# Patient Record
Sex: Male | Born: 1990 | Race: Black or African American | Hispanic: No | Marital: Single | Smoking: Never smoker
Health system: Southern US, Community
[De-identification: ages and names within clinical notes are randomized; demographics above are authoritative.]

---

## 2017-03-03 ENCOUNTER — Emergency Department (HOSPITAL_COMMUNITY)
Admission: EM | Admit: 2017-03-03 | Discharge: 2017-03-03 | Disposition: A | Discharge status: Home / Self Care | Payer: Self-pay | Attending: Emergency Medicine | Admitting: Emergency Medicine

## 2017-03-03 ENCOUNTER — Emergency Department (HOSPITAL_COMMUNITY): Payer: Self-pay

## 2017-03-03 ENCOUNTER — Encounter (HOSPITAL_COMMUNITY): Payer: Self-pay

## 2017-03-03 ENCOUNTER — Other Ambulatory Visit: Payer: Self-pay

## 2017-03-03 DIAGNOSIS — R05 Cough: Secondary | ICD-10-CM | POA: Insufficient documentation

## 2017-03-03 DIAGNOSIS — R059 Cough, unspecified: Secondary | ICD-10-CM

## 2017-03-03 DIAGNOSIS — R0789 Other chest pain: Secondary | ICD-10-CM | POA: Insufficient documentation

## 2017-03-03 MED ORDER — IPRATROPIUM-ALBUTEROL 0.5-2.5 (3) MG/3ML IN SOLN
3.0000 mL | Freq: Once | RESPIRATORY_TRACT | Status: AC
  Administered 2017-03-03: 3 mL via RESPIRATORY_TRACT
  Filled 2017-03-03: qty 3

## 2017-03-03 MED ORDER — ACETAMINOPHEN 325 MG PO TABS
650.0000 mg | ORAL_TABLET | Freq: Once | ORAL | Status: AC
  Administered 2017-03-03: 650 mg via ORAL
  Filled 2017-03-03: qty 2

## 2017-03-03 MED ORDER — AEROCHAMBER PLUS FLO-VU LARGE MISC
1.0000 | Freq: Once | Status: AC
  Administered 2017-03-03: 1

## 2017-03-03 MED ORDER — ACETAMINOPHEN 500 MG PO TABS: 500.0000 mg | ORAL_TABLET | Freq: Four times a day (QID) | ORAL | 0 refills | Status: DC | PRN

## 2017-03-03 MED ORDER — CETIRIZINE HCL 10 MG PO TABS: 10.0000 mg | ORAL_TABLET | Freq: Every day | ORAL | 1 refills | Status: DC

## 2017-03-03 MED ORDER — ALBUTEROL SULFATE HFA 108 (90 BASE) MCG/ACT IN AERS
2.0000 | INHALATION_SPRAY | Freq: Once | RESPIRATORY_TRACT | Status: AC
  Administered 2017-03-03: 2 via RESPIRATORY_TRACT
  Filled 2017-03-03: qty 6.7

## 2017-03-03 NOTE — ED Provider Notes (Signed)
[facility]   CSN: [id number] Arrival date & time: 03/03/17  1323     History   Chief Complaint Chief Complaint  Patient presents with  . Chest Pain  . Headache  . Cough    HPI Eugene Middleton is a 26 y.o. male.  Eugene Middleton is a 26 y.o. who presents to the emergency department complaining of cough ongoing for the past 2 weeks with associated substernal nonradiating chest pain.  He reports cough starting about 2 weeks ago and later developing chest pain. He reports his chest pain is constant and is worse with palpation of the area.  He denies any current shortness of breath.  He reports dry cough that is worse when he lies down to sleep at night.  No treatments attempted prior to arrival.  He denies wheezing or fevers.  He denies any allergy symptoms currently.  He does report a history of allergies, but has not been taking his medications for them. He is not a smoker.   He denies fevers, wheezing, sneezing, nasal congestion, postnasal drip, ear pain, shortness of breath, abdominal pain, nausea, hemoptysis, vomiting or rashes.   The history is provided by the patient and medical records. No language interpreter was used.  Chest Pain   Associated symptoms include cough. Pertinent negatives include no abdominal pain, no back pain, no fever, no nausea, no palpitations, no shortness of breath and no vomiting.  Headache   Pertinent negatives include no fever, no palpitations, no shortness of breath, no nausea and no vomiting.  Cough  Associated symptoms include chest pain. Pertinent negatives include no chills, no rhinorrhea, no sore throat, no shortness of breath and no wheezing.    History reviewed. No pertinent past medical history.  There are no active problems to display for this patient.   History reviewed. No pertinent surgical history.     Home Medications    Prior to Admission medications     Medication Sig Start Date End Date Taking? Authorizing Provider  acetaminophen (TYLENOL) 500 MG tablet Take 1 tablet (500 mg total) by mouth every 6 (six) hours as needed. 03/03/17   Everlene Farrieransie, Ahren Pettinger, PA-C  cetirizine (ZYRTEC ALLERGY) 10 MG tablet Take 1 tablet (10 mg total) by mouth daily. 03/03/17   Everlene Farrieransie, Marijo Quizon, PA-C    Family History History reviewed. No pertinent family history.  Social History Social History   Tobacco Use  . Smoking status: Never Smoker  Substance Use Topics  . Alcohol use: No    Frequency: Never  . Drug use: No     Allergies   Patient has no known allergies.   Review of Systems Review of Systems  Constitutional: Negative for chills and fever.  HENT: Negative for congestion, nosebleeds, postnasal drip, rhinorrhea and sore throat.   Eyes: Negative for visual disturbance.  Respiratory: Positive for cough. Negative for shortness of breath and wheezing.   Cardiovascular: Positive for chest pain. Negative for palpitations and leg swelling.  Gastrointestinal: Negative for abdominal pain, diarrhea, nausea and vomiting.  Genitourinary: Negative for dysuria.  Musculoskeletal: Negative for back pain and neck pain.  Skin: Negative for rash.  Neurological: Negative for light-headedness.     Physical Exam Updated Vital Signs BP 127/78 (BP Location: Right Arm)   Pulse 67   Temp 98.5 F (36.9 C) (Oral)   Resp 16   Ht 5\' 9"  (1.753 m)   Wt 79.4 kg (175 lb)   SpO2 100%  BMI 25.84 kg/m   Physical Exam  Constitutional: He appears well-developed and well-nourished.  Non-toxic appearance. He does not appear ill. No distress.  HENT:  Head: Normocephalic and atraumatic.  Boggy nasal turbinates and rhinorrhea present.  Eyes: Conjunctivae are normal. Pupils are equal, round, and reactive to light. Right eye exhibits no discharge. Left eye exhibits no discharge.  Neck: Neck supple.  Cardiovascular: Normal rate, regular rhythm, normal heart sounds and intact  distal pulses.  Pulses:      Radial pulses are 2+ on the right side, and 2+ on the left side.  Pulmonary/Chest: Effort normal and breath sounds normal. No accessory muscle usage. No tachypnea. No respiratory distress. He has no decreased breath sounds.  Lungs are clear to ascultation bilaterally. Symmetric chest expansion bilaterally. No increased work of breathing. No rales or rhonchi.   Anterior chest wall is tender palpation and reproduces his chest pain.  Abdominal: Soft. There is no tenderness.  Lymphadenopathy:    He has no cervical adenopathy.  Neurological: He is alert. Coordination normal.  Skin: Skin is warm and dry. Capillary refill takes less than 2 seconds. No rash noted. He is not diaphoretic.  Psychiatric: He has a normal mood and affect. His behavior is normal.  Nursing note and vitals reviewed.    ED Treatments / Results  Labs (all labs ordered are listed, but only abnormal results are displayed) Labs Reviewed - No data to display  EKG  EKG Interpretation  Date/Time:  Thursday March 03 2017 13:33:20 EST Ventricular Rate:  75 PR Interval:  130 QRS Duration: 84 QT Interval:  356 QTC Calculation: 397 R Axis:   91 Text Interpretation:  Sinus rhythm with occasional Premature ventricular complexes Rightward axis Borderline ECG No old tracing to compare Confirmed by Linwood Dibbles [id number]) on 03/03/2017 7:18:14 PM       Radiology Dg Chest 2 View  Result Date: 03/03/2017 CLINICAL DATA:  Chest pain and shortness of breath for 2 days EXAM: CHEST  2 VIEW COMPARISON:  None. FINDINGS: The heart size and mediastinal contours are within normal limits. Both lungs are clear. The visualized skeletal structures are unremarkable. IMPRESSION: No active cardiopulmonary disease. Electronically Signed   By: Alcide Clever M.D.   On: 03/03/2017 18:49    Procedures Procedures (including critical care time)  Medications Ordered in ED Medications  albuterol (PROVENTIL HFA;VENTOLIN HFA)  108 (90 Base) MCG/ACT inhaler 2 puff (2 puffs Inhalation Not Given 03/03/17 1925)  AEROCHAMBER PLUS FLO-VU LARGE MISC 1 each (not administered)  ipratropium-albuterol (DUONEB) 0.5-2.5 (3) MG/3ML nebulizer solution 3 mL (3 mLs Nebulization Given 03/03/17 1754)  acetaminophen (TYLENOL) tablet 650 mg (650 mg Oral Given 03/03/17 1754)     Initial Impression / Assessment and Plan / ED Course  I have reviewed the triage vital signs and the nursing notes.  Pertinent labs & imaging results that were available during my care of the patient were reviewed by me and considered in my medical decision making (see chart for details).     This  is a 26 y.o. who presents to the emergency department complaining of cough ongoing for the past 2 weeks with associated substernal nonradiating chest pain.  He reports cough starting about 2 weeks ago and later developing chest pain. He reports his chest pain is constant and is worse with palpation of the area.  He denies any current shortness of breath.  He reports dry cough that is worse when he lies down to sleep at night.  No treatments attempted prior to arrival.  He denies wheezing or fevers.  He denies any allergy symptoms currently.  He does report a history of allergies, but has not been taking his medications for them. He is not a smoker. On exam the patient is afebrile nontoxic-appearing.  His lungs are clear to auscultation bilaterally.  No increased work of breathing.  No wheezes, rales or rhonchi noted on my exam.  He does have some rhinorrhea present on my exam.  He also has reproducible chest wall tenderness to palpation against his anterior chest wall.  No overlying skin changes. EKG is without evidence of STEMI. Patient was provided with breathing treatment, Tylenol and a chest x-ray was obtained.  Chest x-ray is unremarkable. At reevaluation following the breathing treatment and Tylenol patient tells me he is feeling much better.  He believes the breathing  treatment helped.  Repeat lung exam is unchanged.  No wheezing.  No increased work of breathing.  No rales or rhonchi.  No tachypnea or tachycardia.  Low suspicion for ACS in this patient.  I am more suspicious patient is having upper respiratory infection or has allergic rhinitis which is causing his cough.  We will restart him on Zyrtec as well as provide him with an inhaler to use at home.  I discussed instructions on when and how to use the inhaler.  I encouraged him to follow-up with primary care.  Return precautions discussed. I advised the patient to follow-up with their primary care provider this week. I advised the patient to return to the emergency department with new or worsening symptoms or new concerns. The patient verbalized understanding and agreement with plan.     Final Clinical Impressions(s) / ED Diagnoses   Final diagnoses:  Cough  Chest wall pain    ED Discharge Orders        Ordered    cetirizine (ZYRTEC ALLERGY) 10 MG tablet  Daily     03/03/17 1927    acetaminophen (TYLENOL) 500 MG tablet  Every 6 hours PRN     03/03/17 1927       Everlene FarrierDansie, Kwane Rohl, PA-C 03/03/17 1932    Linwood DibblesKnapp, Jon, MD 03/03/17 2352

## 2017-03-03 NOTE — ED Triage Notes (Addendum)
Pt endorses having dry cough for 2 weeks then began having chest pain due to the cough. Chest pain is non radiating, no shob. VSS. Pt has chronic headaches since high school.

## 2017-03-03 NOTE — ED Notes (Signed)
Patient transported to X-ray 

## 2017-10-11 ENCOUNTER — Emergency Department (HOSPITAL_COMMUNITY)
Admission: EM | Admit: 2017-10-11 | Discharge: 2017-10-12 | Disposition: A | Discharge status: Home / Self Care | Payer: Self-pay | Attending: Emergency Medicine | Admitting: Emergency Medicine

## 2017-10-11 ENCOUNTER — Other Ambulatory Visit: Payer: Self-pay

## 2017-10-11 DIAGNOSIS — R569 Unspecified convulsions: Secondary | ICD-10-CM | POA: Insufficient documentation

## 2017-10-12 ENCOUNTER — Emergency Department (HOSPITAL_COMMUNITY): Payer: Self-pay

## 2017-10-12 ENCOUNTER — Encounter (HOSPITAL_COMMUNITY): Payer: Self-pay | Admitting: Emergency Medicine

## 2017-10-12 LAB — CBC
HCT: 43.3 % (ref 39.0–52.0)
HEMOGLOBIN: 13.7 g/dL (ref 13.0–17.0)
MCH: 28.4 pg (ref 26.0–34.0)
MCHC: 31.6 g/dL (ref 30.0–36.0)
MCV: 89.8 fL (ref 78.0–100.0)
PLATELETS: 213 10*3/uL (ref 150–400)
RBC: 4.82 MIL/uL (ref 4.22–5.81)
RDW: 12.5 % (ref 11.5–15.5)
WBC: 4.5 10*3/uL (ref 4.0–10.5)

## 2017-10-12 LAB — I-STAT TROPONIN, ED: Troponin i, poc: 0 ng/mL (ref 0.00–0.08)

## 2017-10-12 LAB — BASIC METABOLIC PANEL
ANION GAP: 11 (ref 5–15)
BUN: 9 mg/dL (ref 6–20)
CO2: 26 mmol/L (ref 22–32)
Calcium: 8.8 mg/dL — ABNORMAL LOW (ref 8.9–10.3)
Chloride: 103 mmol/L (ref 98–111)
Creatinine, Ser: 1.04 mg/dL (ref 0.61–1.24)
GFR calc non Af Amer: >60 mL/min (ref >60)
Glucose, Bld: 103 mg/dL — ABNORMAL HIGH (ref 70–99)
Potassium: 3.7 mmol/L (ref 3.5–5.1)
SODIUM: 140 mmol/L (ref 135–145)

## 2017-10-12 LAB — RAPID URINE DRUG SCREEN, HOSP PERFORMED
AMPHETAMINES: NOT DETECTED
BENZODIAZEPINES: NOT DETECTED
COCAINE: NOT DETECTED
OPIATES: NOT DETECTED
TETRAHYDROCANNABINOL: NOT DETECTED

## 2017-10-12 LAB — HEPATIC FUNCTION PANEL
ALBUMIN: 3.9 g/dL (ref 3.5–5.0)
ALT: 20 U/L (ref 0–44)
AST: 26 U/L (ref 15–41)
Alkaline Phosphatase: 69 U/L (ref 38–126)
BILIRUBIN INDIRECT: 0.8 mg/dL (ref 0.3–0.9)
Bilirubin, Direct: 0.1 mg/dL (ref 0.0–0.2)
TOTAL PROTEIN: 7 g/dL (ref 6.5–8.1)
Total Bilirubin: 0.9 mg/dL (ref 0.3–1.2)

## 2017-10-12 LAB — CK: CK TOTAL: 319 U/L (ref 49–397)

## 2017-10-12 MED ORDER — LEVETIRACETAM 500 MG PO TABS: 500.0000 mg | ORAL_TABLET | Freq: Two times a day (BID) | ORAL | 0 refills | Status: DC

## 2017-10-12 MED ORDER — LEVETIRACETAM 500 MG PO TABS
1000.0000 mg | ORAL_TABLET | Freq: Once | ORAL | Status: AC
  Administered 2017-10-12: 1000 mg via ORAL
  Filled 2017-10-12: qty 2

## 2017-10-12 NOTE — ED Notes (Signed)
Patient transported to X-ray 

## 2017-10-12 NOTE — ED Notes (Signed)
Pt discharged from ED; instructions provided and scripts given; Pt encouraged to return to ED if symptoms worsen and to f/u with PCP; Pt verbalized understanding of all instructions 

## 2017-10-12 NOTE — ED Triage Notes (Signed)
Pt reports he had an episode of "shaking" and then his eyes rolled back in his head. Pt also reports CP and wrist pain. Denies hx of seizures.

## 2017-10-12 NOTE — ED Provider Notes (Signed)
[facility]   CSN: [id number] Arrival date & time: 10/11/17  2347     History   Chief Complaint Chief Complaint  Patient presents with  . Seizure Like Activity    HPI Eugene Middleton is a 27 y.o. male.  The history is provided by the patient.  He was told that he fell down and then had some generalized shaking.  He has no memory of this incident.  He did bite his lip.  Denies bowel or bladder incontinence.  He is not complaining of anything currently.  There is no known history of seizures.  He does not take any medications other than vitamins.  He denies ethanol and drug use.  History reviewed. No pertinent past medical history.  There are no active problems to display for this patient.   History reviewed. No pertinent surgical history.      Home Medications    Prior to Admission medications   Medication Sig Start Date End Date Taking? Authorizing Provider  acetaminophen (TYLENOL) 500 MG tablet Take 1 tablet (500 mg total) by mouth every 6 (six) hours as needed. 03/03/17   Everlene Farrieransie, William, PA-C  cetirizine (ZYRTEC ALLERGY) 10 MG tablet Take 1 tablet (10 mg total) by mouth daily. 03/03/17   Everlene Farrieransie, William, PA-C    Family History No family history on file.  Social History Social History   Tobacco Use  . Smoking status: Never Smoker  . Smokeless tobacco: Never Used  Substance Use Topics  . Alcohol use: No    Frequency: Never  . Drug use: No     Allergies   Patient has no known allergies.   Review of Systems Review of Systems  All other systems reviewed and are negative.    Physical Exam Updated Vital Signs BP 135/76   Pulse (!) 56   Temp 98 F (36.7 C) (Oral)   Resp 14   Ht 5\' 8"  (1.727 m)   Wt 97.5 kg (215 lb)   SpO2 100%   BMI 32.69 kg/m   Physical Exam  Nursing note and vitals reviewed.  27 year old male, resting comfortably and in no acute distress. Vital signs are normal.  Oxygen saturation is 100%, which is normal. Head is normocephalic and atraumatic. PERRLA, EOMI. Oropharynx is clear. Neck is nontender and supple without adenopathy or JVD. Back is nontender and there is no CVA tenderness. Lungs are clear without rales, wheezes, or rhonchi. Chest is nontender. Heart has regular rate and rhythm without murmur. Abdomen is soft, flat, nontender without masses or hepatosplenomegaly and peristalsis is normoactive. Extremities have no cyanosis or edema, full range of motion is present. Skin is warm and dry without rash. Neurologic: Mental status is normal, cranial nerves are intact, there are no motor or sensory deficits.  ED Treatments / Results  Labs (all labs ordered are listed, but only abnormal results are displayed) Labs Reviewed  BASIC METABOLIC PANEL - Abnormal; Notable for the following components:      Result Value   Glucose, Bld 103 (*)    Calcium 8.8 (*)    All other components within normal limits  RAPID URINE DRUG SCREEN, HOSP PERFORMED - Abnormal; Notable for the following components:   Barbiturates   (*)    Value: Result not available. Reagent lot number recalled by manufacturer.   All other components within normal limits  CBC  CK  HEPATIC FUNCTION PANEL  I-STAT TROPONIN, ED    EKG EKG  Interpretation  Date/Time:  Wednesday October 12 2017 00:09:36 EDT Ventricular Rate:  68 PR Interval:  164 QRS Duration: 104 QT Interval:  374 QTC Calculation: 397 R Axis:   33 Text Interpretation:  Normal sinus rhythm Normal ECG When compared with ECG of 03/03/2017, Right axis deviation is no longer present Confirmed by Dione Booze ([id number]) on 10/12/2017 4:04:10 AM   Radiology Dg Chest 2 View  Result Date: 10/12/2017 CLINICAL DATA:  Episode of shaking. EXAM: CHEST - 2 VIEW COMPARISON:  None. FINDINGS: Mild cardiomegaly. Mediastinal contours are within normal limits. Both lungs are clear. The visualized skeletal structures are unremarkable.  IMPRESSION: No active cardiopulmonary disease.  Mild cardiac enlargement. Electronically Signed   By: Tollie Eth M.D.   On: 10/12/2017 00:51   Ct Head Wo Contrast  Result Date: 10/12/2017 CLINICAL DATA:  27 y/o M; shaking episode. Possible seizure, new, nontraumatic, 18-40 yrs. EXAM: CT HEAD WITHOUT CONTRAST TECHNIQUE: Contiguous axial images were obtained from the base of the skull through the vertex without intravenous contrast. COMPARISON:  None. 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 No finding of stroke, hemorrhage, or focal mass effect of the brain parenchyma. No extra-axial collection, hydrocephalus, or herniation. Vascular: No hyperdense vessel or unexpected calcification. Skull: Normal. Negative for fracture or focal lesion. Sinuses/Orbits: No acute finding. Other: None. IMPRESSION: 1. 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 2. No acute intracranial abnormality. Electronically Signed   By: Mitzi Hansen M.D.   On: 10/12/2017 06:28    Procedures Procedures  Medications Ordered in ED Medications  levETIRAcetam (KEPPRA) tablet 1,000 mg (has no administration in time range)     Initial Impression / Assessment and Plan / ED Course  I have reviewed the triage vital signs and the nursing notes.  Pertinent labs & imaging results that were available during my care of the patient were reviewed by me and considered in my medical decision making (see chart for details).  Probable seizure.  Will check screening labs and CT of head.  Old records are reviewed, and he has no relevant prior visits.  Laboratory work-up is unremarkable.  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  This would put him at increased risk for recurrent seizures, so he is started on levetiracetam,  referred to neurology for outpatient work-up and management.  Final Clinical Impressions(s) / ED Diagnoses   Final diagnoses:  Seizure [facility])    ED Discharge Orders        Ordered    levETIRAcetam (KEPPRA) 500 MG tablet  2 times daily     10/12/17 0715       Dione Booze, MD 10/12/17 [id number]

## 2017-10-12 NOTE — ED Notes (Signed)
Pt bk from Xray 

## 2017-10-12 NOTE — Discharge Instructions (Addendum)
Follow up with one of the neurology groups - you should have an EEG (brain wave test) done in the next 1-2 weeks.

## 2017-10-17 ENCOUNTER — Encounter: Payer: Self-pay | Admitting: Neurology

## 2017-10-17 ENCOUNTER — Ambulatory Visit (INDEPENDENT_AMBULATORY_CARE_PROVIDER_SITE_OTHER): Payer: Self-pay | Admitting: Neurology

## 2017-10-17 VITALS — BP 120/72 | HR 67 | Ht 68.0 in | Wt 210.0 lb

## 2017-10-17 DIAGNOSIS — R55 Syncope and collapse: Secondary | ICD-10-CM

## 2017-10-17 NOTE — Patient Instructions (Addendum)
1. Schedule MRI brain with and without contrast 2. Schedule 1-hour sleep-deprived EEG 3. Schedule echocardiogram 4. Hold off on taking Levetiracetam for now and we will let you know if it's needed after the tests 5. Follow-up in 4 months
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
## 2017-11-10 ENCOUNTER — Encounter (HOSPITAL_COMMUNITY): Payer: Self-pay | Admitting: Radiology

## 2017-12-28 ENCOUNTER — Encounter: Payer: Self-pay | Admitting: Neurology

## 2018-02-17 ENCOUNTER — Ambulatory Visit: Payer: Self-pay | Admitting: Neurology

## 2018-02-22 ENCOUNTER — Ambulatory Visit: Payer: Self-pay | Admitting: Neurology

## 2019-05-08 ENCOUNTER — Emergency Department (HOSPITAL_COMMUNITY)
Admission: EM | Admit: 2019-05-08 | Discharge: 2019-05-08 | Disposition: A | Discharge status: Home / Self Care | Payer: Self-pay | Attending: Emergency Medicine | Admitting: Emergency Medicine

## 2019-05-08 ENCOUNTER — Other Ambulatory Visit: Payer: Self-pay

## 2019-05-08 ENCOUNTER — Emergency Department (HOSPITAL_COMMUNITY): Payer: Self-pay

## 2019-05-08 DIAGNOSIS — M722 Plantar fascial fibromatosis: Secondary | ICD-10-CM | POA: Insufficient documentation

## 2019-05-08 MED ORDER — IBUPROFEN 800 MG PO TABS: 800.0000 mg | ORAL_TABLET | Freq: Three times a day (TID) | ORAL | 0 refills | Status: DC | PRN

## 2019-05-08 NOTE — ED Triage Notes (Signed)
Pt here for evaluation of left foot pain that began on Sunday.  Pt states he lost his balance today while working but denies falling or LOC.

## 2019-05-08 NOTE — Progress Notes (Signed)
Orthopedic Tech Progress Note Patient Details:  Auston Halfmann 1991/03/01 [id number]  Ortho Devices Type of Ortho Device: Postop shoe/boot Ortho Device/Splint Location: LLE Ortho Device/Splint Interventions: Ordered, Application   Post Interventions Patient Tolerated: Ambulated well Instructions Provided: Poper ambulation with device, Care of device, Adjustment of device   Donald Pore 05/08/2019, 6:58 PM

## 2019-05-08 NOTE — ED Provider Notes (Signed)
[facility] EMERGENCY DEPARTMENT Provider Note   CSN: [id number] Arrival date & time: 05/08/19  1621     History No chief complaint on file.   Eugene Middleton is a 29 y.o. male.  HPI Patient presents to the emergency department with left foot pain that started this past Sunday.  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  The patient states that his shoes that he currently wears make the pain worse.  Patient states the pain is along the bottom of the foot.  Patient denies any other injury or trauma to the foot.  Patient states that certain movements palpation make his pain worse.  The patient states he did not take any medications prior to arrival for his symptoms.  Patient states the pain is worse first thing in the morning when he wakes up.    No past medical history on file.  There are no problems to display for this patient.   No past surgical history on file.     No family history on file.  Social History   Tobacco Use  . Smoking status: Never Smoker  . Smokeless tobacco: Never Used  Substance Use Topics  . Alcohol use: No  . Drug use: No    Home Medications Prior to Admission medications   Medication Sig Start Date End Date Taking? Authorizing Provider  levETIRAcetam (KEPPRA) 500 MG tablet Take 1 tablet (500 mg total) by mouth 2 (two) times daily. 10/12/17   Dione Booze, MD    Allergies    Patient has no known allergies.  Review of Systems   Review of Systems All other systems negative except as documented in the HPI. All pertinent positives and negatives as reviewed in the HPI. Physical Exam Updated Vital Signs BP 139/82 (BP Location: Right Arm)   Pulse 75   Temp 98.6 F (37 C) (Oral)   Resp 16   SpO2 99%   Physical Exam Vitals and nursing note reviewed.  Constitutional:      General: He is not in acute distress.    Appearance: He is well-developed.  HENT:     Head: Normocephalic and atraumatic.    Eyes:     Pupils: Pupils are equal, round, and reactive to light.  Pulmonary:     Effort: Pulmonary effort is normal.  Musculoskeletal:       Feet:  Skin:    General: Skin is warm and dry.  Neurological:     Mental Status: He is alert and oriented to person, place, and time.     ED Results / Procedures / Treatments   Labs (all labs ordered are listed, but only abnormal results are displayed) Labs Reviewed - No data to display  EKG None  Radiology DG Foot Complete Left  Result Date: 05/08/2019 CLINICAL DATA:  Pain EXAM: LEFT FOOT - COMPLETE 3+ VIEW COMPARISON:  None. FINDINGS: Frontal, oblique, and lateral views were obtained. There is no fracture or dislocation. There is no appreciable joint space narrowing or erosion. A tiny cyst is noted along the medial most aspect of the proximal aspect of the first proximal phalanx. There is pes planus. IMPRESSION: Pes planus. No fracture or dislocation. No appreciable arthropathy. Electronically Signed   By: Bretta Bang III M.D.   On: 05/08/2019 17:02    Procedures Procedures (including critical care time)  Medications Ordered in ED Medications - No data to display  ED Course  I have reviewed the triage  vital signs and the nursing notes.  Pertinent labs & imaging results that were available during my care of the patient were reviewed by me and considered in my medical decision making (see chart for details).    MDM Rules/Calculators/A&P                     Patient be treated for plantar fasciitis and this is based on the fact that he does stand for long periods of time and where the location of his pain is.  The patient is advised to return here as needed.  Patient will be given orthopedic follow-up as needed.  Patient agrees the plan and all questions were answered. Final Clinical Impression(s) / ED Diagnoses Final diagnoses:  None    Rx / DC Orders ED Discharge Orders    None       Rebeca Allegra 05/08/19 Rubin Payor, MD 05/09/19 [phone]

## 2019-05-08 NOTE — Discharge Instructions (Signed)
Return here as needed.  Follow-up with the doctor provided if not improving. °

## 2019-05-28 ENCOUNTER — Emergency Department (HOSPITAL_COMMUNITY): Payer: Self-pay

## 2019-05-28 ENCOUNTER — Emergency Department (HOSPITAL_COMMUNITY): Payer: Self-pay | Admitting: Anesthesiology

## 2019-05-28 ENCOUNTER — Encounter (HOSPITAL_COMMUNITY)
Admission: EM | Disposition: A | Discharge status: Home / Self Care | Payer: Self-pay | Source: Home / Self Care | Attending: Emergency Medicine

## 2019-05-28 ENCOUNTER — Encounter (HOSPITAL_COMMUNITY): Payer: Self-pay

## 2019-05-28 ENCOUNTER — Ambulatory Visit (HOSPITAL_COMMUNITY)
Admission: EM | Admit: 2019-05-28 | Discharge: 2019-05-28 | Disposition: A | Discharge status: Home / Self Care | Payer: Self-pay | Attending: Orthopedic Surgery | Admitting: Orthopedic Surgery

## 2019-05-28 ENCOUNTER — Other Ambulatory Visit: Payer: Self-pay

## 2019-05-28 DIAGNOSIS — Z79899 Other long term (current) drug therapy: Secondary | ICD-10-CM | POA: Insufficient documentation

## 2019-05-28 DIAGNOSIS — Z791 Long term (current) use of non-steroidal anti-inflammatories (NSAID): Secondary | ICD-10-CM | POA: Insufficient documentation

## 2019-05-28 DIAGNOSIS — M659 Synovitis and tenosynovitis, unspecified: Secondary | ICD-10-CM

## 2019-05-28 DIAGNOSIS — W2209XA Striking against other stationary object, initial encounter: Secondary | ICD-10-CM | POA: Insufficient documentation

## 2019-05-28 DIAGNOSIS — M65141 Other infective (teno)synovitis, right hand: Secondary | ICD-10-CM | POA: Insufficient documentation

## 2019-05-28 DIAGNOSIS — Z20822 Contact with and (suspected) exposure to covid-19: Secondary | ICD-10-CM | POA: Insufficient documentation

## 2019-05-28 DIAGNOSIS — Z23 Encounter for immunization: Secondary | ICD-10-CM | POA: Insufficient documentation

## 2019-05-28 DIAGNOSIS — M65321 Trigger finger, right index finger: Secondary | ICD-10-CM | POA: Insufficient documentation

## 2019-05-28 HISTORY — PX: I & D EXTREMITY: SHX5045

## 2019-05-28 LAB — CBC WITH DIFFERENTIAL/PLATELET
Abs Immature Granulocytes: 0.01 10*3/uL (ref 0.00–0.07)
Basophils Absolute: 0 10*3/uL (ref 0.0–0.1)
Basophils Relative: 1 %
Eosinophils Absolute: 0.1 10*3/uL (ref 0.0–0.5)
Eosinophils Relative: 2 %
HCT: 44.3 % (ref 39.0–52.0)
Hemoglobin: 14.2 g/dL (ref 13.0–17.0)
Immature Granulocytes: 0 %
Lymphocytes Relative: 33 %
Lymphs Abs: 1.7 10*3/uL (ref 0.7–4.0)
MCH: 28.9 pg (ref 26.0–34.0)
MCHC: 32.1 g/dL (ref 30.0–36.0)
MCV: 90.2 fL (ref 80.0–100.0)
Monocytes Absolute: 0.7 10*3/uL (ref 0.1–1.0)
Monocytes Relative: 13 %
Neutro Abs: 2.7 10*3/uL (ref 1.7–7.7)
Neutrophils Relative %: 51 %
Platelets: 214 10*3/uL (ref 150–400)
RBC: 4.91 MIL/uL (ref 4.22–5.81)
RDW: 12.5 % (ref 11.5–15.5)
WBC: 5.3 10*3/uL (ref 4.0–10.5)
nRBC: 0 % (ref 0.0–0.2)

## 2019-05-28 LAB — I-STAT CHEM 8, ED
BUN: 21 mg/dL — ABNORMAL HIGH (ref 6–20)
Calcium, Ion: 1.17 mmol/L (ref 1.15–1.40)
Chloride: 103 mmol/L (ref 98–111)
Creatinine, Ser: 1.1 mg/dL (ref 0.61–1.24)
Glucose, Bld: 90 mg/dL (ref 70–99)
HCT: 43 % (ref 39.0–52.0)
Hemoglobin: 14.6 g/dL (ref 13.0–17.0)
Potassium: 4.1 mmol/L (ref 3.5–5.1)
Sodium: 141 mmol/L (ref 135–145)
TCO2: 27 mmol/L (ref 22–32)

## 2019-05-28 LAB — RESPIRATORY PANEL BY RT PCR (FLU A&B, COVID)
Influenza A by PCR: NEGATIVE
Influenza B by PCR: NEGATIVE
SARS Coronavirus 2 by RT PCR: NEGATIVE

## 2019-05-28 SURGERY — IRRIGATION AND DEBRIDEMENT EXTREMITY
Anesthesia: General | Laterality: Right

## 2019-05-28 MED ORDER — ONDANSETRON HCL 4 MG/2ML IJ SOLN
INTRAMUSCULAR | Status: AC
  Filled 2019-05-28: qty 2

## 2019-05-28 MED ORDER — POVIDONE-IODINE 10 % EX SWAB
2.0000 "application " | Freq: Once | CUTANEOUS | Status: AC
  Administered 2019-05-28: 2 via TOPICAL

## 2019-05-28 MED ORDER — BUPIVACAINE HCL 0.25 % IJ SOLN
INTRAMUSCULAR | Status: AC
  Filled 2019-05-28: qty 1

## 2019-05-28 MED ORDER — LIDOCAINE 2% (20 MG/ML) 5 ML SYRINGE
INTRAMUSCULAR | Status: AC
  Filled 2019-05-28: qty 5

## 2019-05-28 MED ORDER — BUPIVACAINE HCL (PF) 0.25 % IJ SOLN
INTRAMUSCULAR | Status: DC | PRN
  Administered 2019-05-28: 10 mL

## 2019-05-28 MED ORDER — PROPOFOL 10 MG/ML IV BOLUS
INTRAVENOUS | Status: AC
  Filled 2019-05-28: qty 20

## 2019-05-28 MED ORDER — FENTANYL CITRATE (PF) 100 MCG/2ML IJ SOLN
INTRAMUSCULAR | Status: DC | PRN
  Administered 2019-05-28 (×4): 50 ug via INTRAVENOUS

## 2019-05-28 MED ORDER — CEPHALEXIN 500 MG PO CAPS: 500.0000 mg | ORAL_CAPSULE | Freq: Four times a day (QID) | ORAL | 0 refills | Status: AC

## 2019-05-28 MED ORDER — PROMETHAZINE HCL 25 MG/ML IJ SOLN: 6.2500 mg | INTRAMUSCULAR | Status: DC | PRN

## 2019-05-28 MED ORDER — HYDROCODONE-ACETAMINOPHEN 5-325 MG PO TABS: 1.0000 | ORAL_TABLET | Freq: Four times a day (QID) | ORAL | 0 refills | Status: AC | PRN

## 2019-05-28 MED ORDER — CEFAZOLIN SODIUM-DEXTROSE 2-4 GM/100ML-% IV SOLN
2.0000 g | INTRAVENOUS | Status: AC
  Administered 2019-05-28: 2 g via INTRAVENOUS
  Filled 2019-05-28: qty 100

## 2019-05-28 MED ORDER — IBUPROFEN 800 MG PO TABS
800.0000 mg | ORAL_TABLET | Freq: Once | ORAL | Status: AC
  Administered 2019-05-28: 800 mg via ORAL
  Filled 2019-05-28: qty 1

## 2019-05-28 MED ORDER — FENTANYL CITRATE (PF) 100 MCG/2ML IJ SOLN
INTRAMUSCULAR | Status: AC
  Filled 2019-05-28: qty 2

## 2019-05-28 MED ORDER — ACETAMINOPHEN 500 MG PO TABS
1000.0000 mg | ORAL_TABLET | Freq: Once | ORAL | Status: AC
  Administered 2019-05-28: 1000 mg via ORAL
  Filled 2019-05-28: qty 2

## 2019-05-28 MED ORDER — SODIUM CHLORIDE 0.9 % IV SOLN: 2.0000 g | Freq: Once | INTRAVENOUS | Status: DC

## 2019-05-28 MED ORDER — FENTANYL CITRATE (PF) 100 MCG/2ML IJ SOLN: 25.0000 ug | INTRAMUSCULAR | Status: DC | PRN

## 2019-05-28 MED ORDER — PIPERACILLIN-TAZOBACTAM 3.375 G IVPB 30 MIN
3.3750 g | Freq: Once | INTRAVENOUS | Status: AC
  Administered 2019-05-28: 3.375 g via INTRAVENOUS
  Filled 2019-05-28: qty 50

## 2019-05-28 MED ORDER — PIPERACILLIN-TAZOBACTAM 3.375 G IVPB: 3.3750 g | Freq: Three times a day (TID) | INTRAVENOUS | Status: DC

## 2019-05-28 MED ORDER — TETANUS-DIPHTH-ACELL PERTUSSIS 5-2.5-18.5 LF-MCG/0.5 IM SUSP
0.5000 mL | Freq: Once | INTRAMUSCULAR | Status: AC
  Administered 2019-05-28: 0.5 mL via INTRAMUSCULAR
  Filled 2019-05-28: qty 0.5

## 2019-05-28 MED ORDER — LACTATED RINGERS IV SOLN: INTRAVENOUS | Status: DC

## 2019-05-28 MED ORDER — 0.9 % SODIUM CHLORIDE (POUR BTL) OPTIME
TOPICAL | Status: DC | PRN
  Administered 2019-05-28: 1000 mL

## 2019-05-28 MED ORDER — LIDOCAINE 2% (20 MG/ML) 5 ML SYRINGE
INTRAMUSCULAR | Status: DC | PRN
  Administered 2019-05-28: 100 mg via INTRAVENOUS

## 2019-05-28 MED ORDER — MIDAZOLAM HCL 2 MG/2ML IJ SOLN
INTRAMUSCULAR | Status: AC
  Filled 2019-05-28: qty 2

## 2019-05-28 MED ORDER — DEXAMETHASONE SODIUM PHOSPHATE 10 MG/ML IJ SOLN
INTRAMUSCULAR | Status: AC
  Filled 2019-05-28: qty 1

## 2019-05-28 MED ORDER — SODIUM CHLORIDE 0.9 % IV SOLN: Freq: Once | INTRAVENOUS | Status: AC

## 2019-05-28 MED ORDER — PROPOFOL 10 MG/ML IV BOLUS
INTRAVENOUS | Status: DC | PRN
  Administered 2019-05-28: 180 mg via INTRAVENOUS

## 2019-05-28 MED ORDER — VANCOMYCIN HCL IN DEXTROSE 1-5 GM/200ML-% IV SOLN
1000.0000 mg | Freq: Once | INTRAVENOUS | Status: AC
  Administered 2019-05-28: 1000 mg via INTRAVENOUS
  Filled 2019-05-28: qty 200

## 2019-05-28 MED ORDER — KETOROLAC TROMETHAMINE 30 MG/ML IJ SOLN: 30.0000 mg | Freq: Once | INTRAMUSCULAR | Status: DC | PRN

## 2019-05-28 MED ORDER — PIPERACILLIN-TAZOBACTAM 3.375 G IVPB 30 MIN
3.3750 g | Freq: Four times a day (QID) | INTRAVENOUS | Status: DC
  Administered 2019-05-28: 3.375 g via INTRAVENOUS
  Filled 2019-05-28: qty 50

## 2019-05-28 MED ORDER — ONDANSETRON HCL 4 MG/2ML IJ SOLN
INTRAMUSCULAR | Status: DC | PRN
  Administered 2019-05-28: 4 mg via INTRAVENOUS

## 2019-05-28 MED ORDER — MIDAZOLAM HCL 5 MG/5ML IJ SOLN
INTRAMUSCULAR | Status: DC | PRN
  Administered 2019-05-28: 2 mg via INTRAVENOUS

## 2019-05-28 MED ORDER — DEXAMETHASONE SODIUM PHOSPHATE 10 MG/ML IJ SOLN
INTRAMUSCULAR | Status: DC | PRN
  Administered 2019-05-28: 10 mg via INTRAVENOUS

## 2019-05-28 MED ORDER — FENTANYL CITRATE (PF) 100 MCG/2ML IJ SOLN: 50.0000 ug | INTRAMUSCULAR | Status: DC | PRN

## 2019-05-28 MED ORDER — CHLORHEXIDINE GLUCONATE 4 % EX LIQD
60.0000 mL | Freq: Once | CUTANEOUS | Status: AC
  Administered 2019-05-28: 4 via TOPICAL

## 2019-05-28 SURGICAL SUPPLY — 35 items
BNDG COHESIVE 4X5 TAN STRL (GAUZE/BANDAGES/DRESSINGS) ×3 IMPLANT
BNDG CONFORM 2 STRL LF (GAUZE/BANDAGES/DRESSINGS) ×3 IMPLANT
BNDG ELASTIC 3X5.8 VLCR STR LF (GAUZE/BANDAGES/DRESSINGS) IMPLANT
CHLORAPREP W/TINT 26 (MISCELLANEOUS) ×3 IMPLANT
COVER SURGICAL LIGHT HANDLE (MISCELLANEOUS) ×3 IMPLANT
COVER WAND RF STERILE (DRAPES) ×3 IMPLANT
DRAIN PENROSE 0.25X18 (DRAIN) ×3 IMPLANT
DRAPE U-SHAPE 47X51 STRL (DRAPES) ×3 IMPLANT
DRSG EMULSION OIL 3X3 NADH (GAUZE/BANDAGES/DRESSINGS) ×3 IMPLANT
ELECT REM PT RETURN 15FT ADLT (MISCELLANEOUS) IMPLANT
GAUZE SPONGE 4X4 12PLY STRL (GAUZE/BANDAGES/DRESSINGS) ×6 IMPLANT
GAUZE XEROFORM 1X8 LF (GAUZE/BANDAGES/DRESSINGS) ×3 IMPLANT
GLOVE BIO SURGEON STRL SZ7.5 (GLOVE) ×3 IMPLANT
GLOVE BIOGEL PI IND STRL 8 (GLOVE) ×1 IMPLANT
GLOVE BIOGEL PI INDICATOR 8 (GLOVE) ×2
GOWN STRL REUS W/ TWL LRG LVL3 (GOWN DISPOSABLE) ×2 IMPLANT
GOWN STRL REUS W/ TWL XL LVL3 (GOWN DISPOSABLE) ×1 IMPLANT
GOWN STRL REUS W/TWL LRG LVL3 (GOWN DISPOSABLE) ×4
GOWN STRL REUS W/TWL XL LVL3 (GOWN DISPOSABLE) ×2
HANDPIECE INTERPULSE COAX TIP (DISPOSABLE)
KIT BASIN OR (CUSTOM PROCEDURE TRAY) ×3 IMPLANT
KIT TURNOVER KIT A (KITS) IMPLANT
MANIFOLD NEPTUNE II (INSTRUMENTS) ×3 IMPLANT
NS IRRIG 1000ML POUR BTL (IV SOLUTION) ×6 IMPLANT
PACK ORTHO EXTREMITY (CUSTOM PROCEDURE TRAY) ×3 IMPLANT
PADDING CAST COTTON 6X4 STRL (CAST SUPPLIES) ×3 IMPLANT
PENCIL SMOKE EVACUATOR (MISCELLANEOUS) IMPLANT
SET HNDPC FAN SPRY TIP SCT (DISPOSABLE) IMPLANT
SUT ETHILON 3 0 PS 1 (SUTURE) IMPLANT
SUT MNCRL AB 4-0 PS2 18 (SUTURE) ×3 IMPLANT
SYR CONTROL 10ML LL (SYRINGE) IMPLANT
TOWEL OR 17X26 10 PK STRL BLUE (TOWEL DISPOSABLE) ×3 IMPLANT
TOWEL OR NON WOVEN STRL DISP B (DISPOSABLE) ×3 IMPLANT
UNDERPAD 30X36 HEAVY ABSORB (UNDERPADS AND DIAPERS) ×6 IMPLANT
WATER STERILE IRR 1000ML POUR (IV SOLUTION) ×3 IMPLANT

## 2019-05-28 NOTE — ED Triage Notes (Signed)
Pt reports R hand swelling and soreness as well at L foot pain and swelling. Denies injury. A&Ox4. Ambulatory.

## 2019-05-28 NOTE — Op Note (Signed)
Date of Surgery: 05/28/2019  INDICATIONS: Mr. Cangelosi is a right-handed 29 y.o.-year-old male with a right index finger flexor tenosynovitis.  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  He had pain at that time.  This occurred on Saturday.  He presented to the emergency department late yesterday evening with increasing pain, erythema, and warmth of the index finger.  He had signs concerning for pyogenic flexor tenosynovitis.  He is indicated for operative intervention with irrigation and debridement.;  The patient did consent to the procedure after discussion of the risks and benefits.  PREOPERATIVE DIAGNOSIS:  1.  Right index finger pyogenic flexor tenosynovitis  POSTOPERATIVE DIAGNOSIS: Same.  PROCEDURE:  1.  Irrigation and debridement of right index flexor tendon sheath 2.  Open A1 pulley release of the right index finger (trigger finger release).  SURGEON: Geralynn Rile, M.D.  ASSIST: None.  ANESTHESIA:  general  IV FLUIDS AND URINE: See anesthesia.  ESTIMATED BLOOD LOSS: 5 mL.  IMPLANTS: None  DRAINS: None  Tourniquet time:  23 minutes at 250 mmHg on right brachium  COMPLICATIONS: None.  DESCRIPTION OF PROCEDURE: The patient was brought to the operating room and placed supine on the operating table.  The patient had been signed prior to the procedure and this was documented. The patient had the anesthesia placed by the anesthesiologist.  A time-out was performed to confirm that this was the correct patient, site, side and location. The patient did receive antibiotics prior to the incision and was re-dosed during the procedure as needed at indicated intervals.  A tourniquet was placed.  The patient had the operative extremity prepped and draped in the standard surgical fashion.      We began the procedure with the A1 pulley release.  We made a transverse incision at the distal palmar crease along the index ray.   Blunt dissection was carried down to the level of the flexor tendon sheath.  We identified the A1 pulley.  Next we sharply incised this with a clean 15 blade.  We then released proximally and distal to completely free up both the flexor digitorum superficialis and flexor digitorum profundus for the index finger.  We ensured adequate release with a blunt retractor and delivery of the flexor tendons through the wound.  Next we turned our attention to the irrigation debridement.  Following the A1 release there was no obvious purulence.  There was some fluid that did egress from the tendon sheath.  Next we made a transverse incision along the DIP crease on the volar aspect.  This allowed access to the A5 pulley.  We then performed a release of the A5 pulley with a clean 15 blade in a longitudinal orientation in line with the flexor tendon.  We then irrigated copiously with normal saline via a 16-gauge angiocatheter.  We flushed 40 cc of sterile saline through the flexor tendon sheath.  This did not demonstrate any gross purulence.  We irrigated until the fluid was clear.   Next, we moved to close the wound.  We placed interrupted 3-0 nylon sutures and both incisions.  Standard sterile dressing and compression bandage was applied.  Tourniquet was dropped at 23 minutes.  There were no noted intraoperative complications.  Patient was transported to PACU in stable condition.  Disposition:  Postoperative bandage will be in place for 3 days.  You may remove this in 3 days and begin showering and/or washing  with soap and water.  Do not submerge underwater.  He will continue on oral antibiotics for 10 days.  I will see him in the office in 10 to 14 days for wound check.  Discharge home from PACU.

## 2019-05-28 NOTE — Transfer of Care (Signed)
Immediate Anesthesia Transfer of Care Note  Patient: Clifton James  Procedure(s) Performed: IRRIGATION AND DEBRIDEMENT EXTREMITY (Right )  Patient Location: PACU  Anesthesia Type:General  Level of Consciousness: awake and alert   Airway & Oxygen Therapy: Patient Spontanous Breathing and Patient connected to face mask oxygen  Post-op Assessment: Report given to RN and Post -op Vital signs reviewed and stable  Post vital signs: Reviewed and stable  Last Vitals:  Vitals Value Taken Time  BP 95/59 05/28/19 1800  Temp    Pulse 62 05/28/19 1800  Resp 14 05/28/19 1800  SpO2 100 % 05/28/19 1800  Vitals shown include unvalidated device data.  Last Pain:  Vitals:   05/28/19 1538  TempSrc: Oral  PainSc:          Complications: No apparent anesthesia complications

## 2019-05-28 NOTE — Anesthesia Postprocedure Evaluation (Signed)
Anesthesia Post Note  Patient: Chief Technology Officer  Procedure(s) Performed: IRRIGATION AND DEBRIDEMENT EXTREMITY (Right )     Patient location during evaluation: PACU Anesthesia Type: General Level of consciousness: awake and alert Pain management: pain level controlled Vital Signs Assessment: post-procedure vital signs reviewed and stable Respiratory status: spontaneous breathing, nonlabored ventilation, respiratory function stable and patient connected to nasal cannula oxygen Cardiovascular status: blood pressure returned to baseline and stable Postop Assessment: no apparent nausea or vomiting Anesthetic complications: no    Last Vitals:  Vitals:   05/28/19 1757 05/28/19 1800  BP: (!) 97/48 (!) 95/59  Pulse: 64 71  Resp: 14 16  Temp: 36.4 C   SpO2: 100% 100%    Last Pain:  Vitals:   05/28/19 1538  TempSrc: Oral  PainSc:                  Jessie Schrieber S

## 2019-05-28 NOTE — ED Provider Notes (Signed)
Patient signed out from Dr. Daun Peacock.  29 year old male here with hand infection.  Dr. Aundria Rud was consulted overnight and plan is for hand PA Cheri Fowler to evaluate the patient in the morning. Physical Exam  BP (!) 146/106   Pulse 74   Temp 98.1 F (36.7 C) (Oral)   Resp 19   SpO2 100%   Physical Exam  ED Course/Procedures     Procedures  MDM  Discussed with PA Tinnie Gens who states he was unaware of this plan but will evaluate the patient.  9:30 AM.  Patient was seen by my Lin Givens and plan is for Dr. Aundria Rud to take him to the operating room this afternoon.  He will likely be discharged after the OR.  Patient to be n.p.o.       Terrilee Files, MD 05/28/19 [phone]

## 2019-05-28 NOTE — Progress Notes (Signed)
Spoke with patient's mother, Hurbert Duran, and informed her that patient is in surgery now. His parents live 2 1/2 hours away. His brother who is a Consulting civil engineer on the [employer] will pick him up by lift to go back to his apartment. His parents will travel up tomorrow to see him.

## 2019-05-28 NOTE — Anesthesia Procedure Notes (Signed)
Procedure Name: LMA Insertion Date/Time: 05/28/2019 5:00 PM Performed by: Worthington Cruzan D, CRNA Pre-anesthesia Checklist: Patient identified, Emergency Drugs available, Suction available and Patient being monitored Patient Re-evaluated:Patient Re-evaluated prior to induction Oxygen Delivery Method: Circle system utilized Preoxygenation: Pre-oxygenation with 100% oxygen Induction Type: IV induction Ventilation: Mask ventilation without difficulty LMA: LMA inserted LMA Size: 4.0 Tube type: Oral Number of attempts: 1 Placement Confirmation: positive ETCO2 and breath sounds checked- equal and bilateral Tube secured with: Tape Dental Injury: Teeth and Oropharynx as per pre-operative assessment

## 2019-05-28 NOTE — ED Notes (Signed)
Patient requested that we call his mother to update her on his status

## 2019-05-28 NOTE — Discharge Instructions (Signed)
-  Keep your right upper extremity elevated with your "hand above your heart."  As often as possible.  -Apply ice to the right index finger as able throughout the day.  -Maintain postoperative bandage for 3 days.  You may remove on the third or fourth day and begin showering and getting wet at that time.  -It is very important that you move the right index finger as often an early as possible to prevent stiffness.  -Complete your 10-day course of antibiotics.  -Follow-up with Dr. Aundria Rud in 10 days for suture removal.

## 2019-05-28 NOTE — Consult Note (Signed)
Reason for Consult:Right index finger infection Referring Physician: Gardner Candle  Eugene Middleton is an 29 y.o. male.  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 Since then the finger has been getting more and more painful. He denies having any sort of wound on the finger. He denies prior hx/o similar. He denies fevers, chills, sweats, N/V. He is RHD.  History reviewed. No pertinent past medical history.  History reviewed. No pertinent surgical history.  History reviewed. No pertinent family history.  Social History:  reports that he has never smoked. He has never used smokeless tobacco. He reports that he does not drink alcohol or use drugs.  Allergies: No Known Allergies  Medications: I have reviewed the patient's current medications.  Results for orders placed or performed during the hospital encounter of 05/28/19 (from the past 48 hour(s))  CBC with Differential/Platelet     Status: None   Collection Time: 05/28/19  4:07 AM  Result Value Ref Range   WBC 5.3 4.0 - 10.5 K/uL   RBC 4.91 4.22 - 5.81 MIL/uL   Hemoglobin 14.2 13.0 - 17.0 g/dL   HCT 64.3 32.9 - 51.8 %   MCV 90.2 80.0 - 100.0 fL   MCH 28.9 26.0 - 34.0 pg   MCHC 32.1 30.0 - 36.0 g/dL   RDW 84.1 66.0 - 63.0 %   Platelets 214 150 - 400 K/uL   nRBC 0.0 0.0 - 0.2 %   Neutrophils Relative % 51 %   Neutro Abs 2.7 1.7 - 7.7 K/uL   Lymphocytes Relative 33 %   Lymphs Abs 1.7 0.7 - 4.0 K/uL   Monocytes Relative 13 %   Monocytes Absolute 0.7 0.1 - 1.0 K/uL   Eosinophils Relative 2 %   Eosinophils Absolute 0.1 0.0 - 0.5 K/uL   Basophils Relative 1 %   Basophils Absolute 0.0 0.0 - 0.1 K/uL   Immature Granulocytes 0 %   Abs Immature Granulocytes 0.01 0.00 - 0.07 K/uL    Comment: Performed at [facility], [address], [place], [place] [address]  Respiratory Panel by RT PCR (Flu A&B, Covid) - Nasopharyngeal Swab     Status: None   Collection Time: 05/28/19   4:48 AM   Specimen: Nasopharyngeal Swab  Result Value Ref Range   SARS Coronavirus 2 by RT PCR NEGATIVE NEGATIVE    Comment: (NOTE) SARS-CoV-2 target nucleic acids are NOT DETECTED. The SARS-CoV-2 RNA is generally detectable in upper respiratoy specimens during the acute phase of infection. The lowest concentration of SARS-CoV-2 viral copies this assay can detect is 131 copies/mL. A negative result does not preclude SARS-Cov-2 infection and should not be used as the sole basis for treatment or other patient management decisions. A negative result may occur with  improper specimen collection/handling, submission of specimen other than nasopharyngeal swab, presence of viral mutation(s) within the areas targeted by this assay, and inadequate number of viral copies (<131 copies/mL). A negative result must be combined with clinical observations, patient history, and epidemiological information. The expected result is Negative. Fact Sheet for Patients:  https://www.moore.com/ Fact Sheet for Healthcare Providers:  https://www.young.biz/ This test is not yet ap proved or cleared by the [place] FDA and  has been authorized for detection and/or diagnosis of SARS-CoV-2 by FDA under an Emergency Use Authorization (EUA). This EUA will remain  in effect (meaning this test can be used) for the duration of the COVID-19 declaration under Section 564(b)(1) of  the Act, 21 U.S.C. section 360bbb-3(b)(1), unless the authorization is terminated or revoked sooner.    Influenza A by PCR NEGATIVE NEGATIVE   Influenza B by PCR NEGATIVE NEGATIVE    Comment: (NOTE) The Xpert Xpress SARS-CoV-2/FLU/RSV assay is intended as an aid in  the diagnosis of influenza from Nasopharyngeal swab specimens and  should not be used as a sole basis for treatment. Nasal washings and  aspirates are unacceptable for Xpert Xpress SARS-CoV-2/FLU/RSV  testing. Fact Sheet for  Patients: PinkCheek.be Fact Sheet for Healthcare Providers: GravelBags.it This test is not yet approved or cleared by the [place] FDA and  has been authorized for detection and/or diagnosis of SARS-CoV-2 by  FDA under an Emergency Use Authorization (EUA). This EUA will remain  in effect (meaning this test can be used) for the duration of the  Covid-19 declaration under Section 564(b)(1) of the Act, 21  U.S.C. section 360bbb-3(b)(1), unless the authorization is  terminated or revoked. Performed at [facility], [place] [address] [place], [place] [address]   I-stat chem 8, ED (not at [facility] or [facility])     Status: Abnormal   Collection Time: 05/28/19  4:50 AM  Result Value Ref Range   Sodium 141 135 - 145 mmol/L   Potassium 4.1 3.5 - 5.1 mmol/L   Chloride 103 98 - 111 mmol/L   BUN 21 (H) 6 - 20 mg/dL   Creatinine, Ser 1.10 0.61 - 1.24 mg/dL   Glucose, Bld 90 70 - 99 mg/dL    Comment: Glucose reference range applies only to samples taken after fasting for at least 8 hours.   Calcium, Ion 1.17 1.15 - 1.40 mmol/L   TCO2 27 22 - 32 mmol/L   Hemoglobin 14.6 13.0 - 17.0 g/dL   HCT 43.0 39.0 - 52.0 %    DG Hand Complete Right  Result Date: 05/28/2019 CLINICAL DATA:  Swelling and soreness.  No known injury. EXAM: RIGHT HAND - COMPLETE 3+ VIEW COMPARISON:  None. FINDINGS: Evidence of fracture. No evidence of arthritis or focal lesion. No sign of radiopaque foreign object. IMPRESSION: No bone or joint abnormality.  No radiopaque foreign object. Electronically Signed   By: Nelson Chimes M.D.   On: 05/28/2019 03:57    Review of Systems  Constitutional: Negative for chills, diaphoresis and fever.  HENT: Negative for ear discharge, ear pain, hearing loss and tinnitus.   Eyes: Negative for photophobia and pain.  Respiratory: Negative for cough and shortness of breath.   Cardiovascular: Negative for chest pain.   Gastrointestinal: Negative for abdominal pain, nausea and vomiting.  Genitourinary: Negative for dysuria, flank pain, frequency and urgency.  Musculoskeletal: Positive for arthralgias (Right index finger). Negative for back pain, myalgias and neck pain.  Neurological: Negative for dizziness and headaches.  Hematological: Does not bruise/bleed easily.  Psychiatric/Behavioral: The patient is not nervous/anxious.    Blood pressure (!) 146/106, pulse 74, temperature 98.1 F (36.7 C), temperature source Oral, resp. rate 19, SpO2 100 %. Physical Exam  Constitutional: He appears well-developed and well-nourished. No distress.  HENT:  Head: Normocephalic and atraumatic.  Eyes: Conjunctivae are normal. Right eye exhibits no discharge. Left eye exhibits no discharge. No scleral icterus.  Cardiovascular: Normal rate and regular rhythm.  Respiratory: Effort normal. No respiratory distress.  Musculoskeletal:     Cervical back: Normal range of motion.     Comments: Right shoulder, elbow, wrist, digits- no skin wounds, mod TTP P1, mod fusiform edema, finger held in slight flexed  position, no instability, no blocks to motion  Sens  Ax/R/M/U intact  Mot   Ax/ R/ PIN/ M/ AIN/ U intact  Rad 2+  Neurological: He is alert.  Skin: Skin is warm and dry. He is not diaphoretic.  Psychiatric: He has a normal mood and affect. His behavior is normal.    Assessment/Plan: Right index finger tenosynovitis -- Plan I&D by Dr. Aundria Rud this afternoon. NPO until then. Anticipate discharge after surgery.    Freeman Caldron, PA-C Orthopedic Surgery [phone] 05/28/2019, 9:17 AM

## 2019-05-28 NOTE — Anesthesia Preprocedure Evaluation (Signed)
Anesthesia Evaluation  Patient identified by MRN, date of birth, ID band Patient awake    Reviewed: Allergy & Precautions, NPO status , Patient's Chart, lab work & pertinent test results  Airway Mallampati: II  TM Distance: >3 FB Neck ROM: Full    Dental no notable dental hx.    Pulmonary neg pulmonary ROS,    Pulmonary exam normal breath sounds clear to auscultation       Cardiovascular negative cardio ROS Normal cardiovascular exam Rhythm:Regular Rate:Normal     Neuro/Psych negative neurological ROS  negative psych ROS   GI/Hepatic negative GI ROS, Neg liver ROS,   Endo/Other  negative endocrine ROS  Renal/GU negative Renal ROS  negative genitourinary   Musculoskeletal negative musculoskeletal ROS (+)   Abdominal   Peds negative pediatric ROS (+)  Hematology negative hematology ROS (+)   Anesthesia Other Findings   Reproductive/Obstetrics negative OB ROS                             Anesthesia Physical Anesthesia Plan  ASA: I  Anesthesia Plan: General   Post-op Pain Management:    Induction: Intravenous  PONV Risk Score and Plan: 3 and Ondansetron, Dexamethasone and Treatment may vary due to age or medical condition  Airway Management Planned: LMA  Additional Equipment:   Intra-op Plan:   Post-operative Plan: Extubation in OR  Informed Consent: I have reviewed the patients History and Physical, chart, labs and discussed the procedure including the risks, benefits and alternatives for the proposed anesthesia with the patient or authorized representative who has indicated his/her understanding and acceptance.   Dental advisory given  Plan Discussed with: CRNA and Surgeon  Anesthesia Plan Comments:         Anesthesia Quick Evaluation  

## 2019-05-28 NOTE — ED Provider Notes (Signed)
[name] DEPT Provider Note   CSN: [id number] Arrival date & time: 05/28/19  0104     History Chief Complaint  Patient presents with  . Hand Swelling  . Foot Swelling    Eugene Middleton is a 29 y.o. male.  The history is provided by the patient.  Hand Pain This is a new problem. The current episode started 2 days ago. The problem occurs constantly. The problem has been gradually worsening. Pertinent negatives include no chest pain, no abdominal pain, no headaches and no shortness of breath. Associated symptoms comments: Ongoing foot pain that he was seen for 3 weeks ago. Nothing aggravates the symptoms. Nothing relieves the symptoms. Treatments tried: ibuprofen yesterday. The treatment provided no relief.  States he closed his R index finger in a door at work on Saturday now it is red and swollen and flexed.       History reviewed. No pertinent past medical history.  There are no problems to display for this patient.   History reviewed. No pertinent surgical history.     History reviewed. No pertinent family history.  Social History   Tobacco Use  . Smoking status: Never Smoker  . Smokeless tobacco: Never Used  Substance Use Topics  . Alcohol use: No  . Drug use: No    Home Medications Prior to Admission medications   Medication Sig Start Date End Date Taking? Authorizing Provider  ibuprofen (ADVIL) 800 MG tablet Take 1 tablet (800 mg total) by mouth every 8 (eight) hours as needed. 05/08/19   Lawyer, Harrell Gave, PA-C  levETIRAcetam (KEPPRA) 500 MG tablet Take 1 tablet (500 mg total) by mouth 2 (two) times daily. 0/99/83   Delora Fuel, MD    Allergies    Patient has no known allergies.  Review of Systems   Review of Systems  Constitutional: Negative for fever.  HENT: Negative for congestion.   Eyes: Negative for visual disturbance.  Respiratory: Negative for shortness of breath.   Cardiovascular: Negative for chest  pain.  Gastrointestinal: Negative for abdominal pain.  Genitourinary: Negative for difficulty urinating.  Musculoskeletal: Positive for arthralgias and joint swelling.  Skin: Negative for wound.  Neurological: Negative for headaches.  Psychiatric/Behavioral: Negative for agitation.  All other systems reviewed and are negative.   Physical Exam Updated Vital Signs BP (!) 153/96   Pulse 63   Temp 98.1 F (36.7 C) (Oral)   Resp 16   SpO2 100%   Physical Exam Vitals and nursing note reviewed.  Constitutional:      General: He is not in acute distress.    Appearance: Normal appearance.  HENT:     Head: Normocephalic and atraumatic.     Nose: Nose normal.  Eyes:     Conjunctiva/sclera: Conjunctivae normal.     Pupils: Pupils are equal, round, and reactive to light.  Cardiovascular:     Rate and Rhythm: Normal rate and regular rhythm.     Pulses: Normal pulses.     Heart sounds: Normal heart sounds.  Pulmonary:     Effort: Pulmonary effort is normal.     Breath sounds: Normal breath sounds.  Musculoskeletal:        General: Tenderness present.     Right hand: Swelling and tenderness present. Decreased range of motion.     Cervical back: Normal range of motion and neck supple.     Right ankle: Normal.     Right Achilles Tendon: Normal.     Left ankle: Normal.  Left Achilles Tendon: Normal.     Right foot: Normal.     Left foot: Normal.     Comments: R index finger, pain along the tendon sheath, redness of the digit with streaking onto the palm. Pain with extension, held in flexion with sausage like swelling.   Skin:    General: Skin is warm and dry.     Capillary Refill: Capillary refill takes less than 2 seconds.  Neurological:     General: No focal deficit present.     Mental Status: He is alert and oriented to person, place, and time.     Deep Tendon Reflexes: Reflexes normal.  Psychiatric:        Thought Content: Thought content normal.     ED Results /  Procedures / Treatments   Labs (all labs ordered are listed, but only abnormal results are displayed) Results for orders placed or performed during the hospital encounter of 05/28/19  I-stat chem 8, ED (not at [facility] or [facility])  Result Value Ref Range   Sodium 141 135 - 145 mmol/L   Potassium 4.1 3.5 - 5.1 mmol/L   Chloride 103 98 - 111 mmol/L   BUN 21 (H) 6 - 20 mg/dL   Creatinine, Ser 9.98 0.61 - 1.24 mg/dL   Glucose, Bld 90 70 - 99 mg/dL   Calcium, Ion 3.38 2.50 - 1.40 mmol/L   TCO2 27 22 - 32 mmol/L   Hemoglobin 14.6 13.0 - 17.0 g/dL   HCT 53.9 76.7 - 34.1 %   DG Hand Complete Right  Result Date: 05/28/2019 CLINICAL DATA:  Swelling and soreness.  No known injury. EXAM: RIGHT HAND - COMPLETE 3+ VIEW COMPARISON:  None. FINDINGS: Evidence of fracture. No evidence of arthritis or focal lesion. No sign of radiopaque foreign object. IMPRESSION: No bone or joint abnormality.  No radiopaque foreign object. Electronically Signed   By: Paulina Fusi M.D.   On: 05/28/2019 03:57   DG Foot Complete Left  Result Date: 05/08/2019 CLINICAL DATA:  Pain EXAM: LEFT FOOT - COMPLETE 3+ VIEW COMPARISON:  None. FINDINGS: Frontal, oblique, and lateral views were obtained. There is no fracture or dislocation. There is no appreciable joint space narrowing or erosion. A tiny cyst is noted along the medial most aspect of the proximal aspect of the first proximal phalanx. There is pes planus. IMPRESSION: Pes planus. No fracture or dislocation. No appreciable arthropathy. Electronically Signed   By: Bretta Bang III M.D.   On: 05/08/2019 17:02    Radiology DG Hand Complete Right  Result Date: 05/28/2019 CLINICAL DATA:  Swelling and soreness.  No known injury. EXAM: RIGHT HAND - COMPLETE 3+ VIEW COMPARISON:  None. FINDINGS: Evidence of fracture. No evidence of arthritis or focal lesion. No sign of radiopaque foreign object. IMPRESSION: No bone or joint abnormality.  No radiopaque foreign object. Electronically Signed    By: Paulina Fusi M.D.   On: 05/28/2019 03:57    Procedures Procedures (including critical care time)  Medications Ordered in ED Medications  vancomycin (VANCOCIN) IVPB 1000 mg/200 mL premix (has no administration in time range)  acetaminophen (TYLENOL) tablet 1,000 mg (1,000 mg Oral Given 05/28/19 0346)  ibuprofen (ADVIL) tablet 800 mg (800 mg Oral Given 05/28/19 0346)  Tdap (BOOSTRIX) injection 0.5 mL (0.5 mLs Intramuscular Given 05/28/19 0440)  piperacillin-tazobactam (ZOSYN) IVPB 3.375 g (0 g Intravenous Stopped 05/28/19 0509)    ED Course  I have reviewed the triage vital signs and the nursing notes.  Pertinent labs &  imaging results that were available during my care of the patient were reviewed by me and considered in my medical decision making (see chart for details).   The pain in the foot is been imaged in the past.  There has not been any new trauma.  There is no redness nor swelling.  Patient is ambulating on it.  There are no indications for additional imaging.    MDM Rules/Calculators/A&P Kanavel sign present, the exam is consistent with flexor tenosynovitis.     Case d/w Dr. Aundria Rud of hand.  Start antibiotics.  NPO to be seen by Dale Cudahy in am.   Final Clinical Impression(s) / ED Diagnoses Final diagnoses:  Flexor tenosynovitis of finger        Jadarrius Maselli, MD 05/28/19 7353

## 2019-05-28 NOTE — Brief Op Note (Signed)
05/28/2019  5:44 PM  PATIENT:  Eugene Middleton  29 y.o. male  PRE-OPERATIVE DIAGNOSIS:  Right index finger tenosynovitis  POST-OPERATIVE DIAGNOSIS:  Right index finger tenosynovitis  PROCEDURE:  Procedure(s): IRRIGATION AND DEBRIDEMENT EXTREMITY (Right)  SURGEON:  Surgeon(s) and Role:    * Yolonda Kida, MD - Primary  PHYSICIAN ASSISTANT:   ASSISTANTS: none   ANESTHESIA:   local and general  EBL:  5 cc  BLOOD ADMINISTERED:none  DRAINS: none   LOCAL MEDICATIONS USED:  MARCAINE     SPECIMEN:  No Specimen  DISPOSITION OF SPECIMEN:  N/A  COUNTS:  YES  TOURNIQUET:   Total Tourniquet Time Documented: area (laterality) - 23 minutes Total: area (laterality) - 23 minutes   DICTATION: .Note written in EPIC  PLAN OF CARE: Discharge to home after PACU  PATIENT DISPOSITION:  PACU - hemodynamically stable.   Delay start of Pharmacological VTE agent (>24hrs) due to surgical blood loss or risk of bleeding: not applicable

## 2019-05-31 ENCOUNTER — Ambulatory Visit: Payer: Self-pay | Admitting: *Deleted

## 2019-05-31 NOTE — Telephone Encounter (Signed)
Pt called to get advice on when to take the bandage off his operative hand. He had surgery on his right hand on March 1 st. He is advised per his d/c papers to remove the bandage after 3 or 4 days. And ok to shower and get it we. Advised him to make sure he dries it well and to see his ortho surgeon on March 15 th. He asked about his pain medicine and encourage to take his pain med as needed. He also wanted to know when he could go back to work, advised to discuss that with his doctor at that visit. He voiced understanding.

## 2019-11-02 IMAGING — CT CT HEAD W/O CM
4 series · 16 of 47 positions shown, 18 images · non-contrast
Comparison: None.

CLINICAL DATA: 26 y/o M; shaking episode. Possible seizure, new,
nontraumatic, 18-40 yrs.

EXAM:
CT HEAD WITHOUT CONTRAST
TECHNIQUE: Contiguous axial images were obtained from the base of the skull
through the vertex without intravenous contrast.

[Series 3: head wo · axial · 0.47mm/px · z∈[-139,-19]mm · 7 of 32 slices shown, 9 images]
[im 4/32  brain]
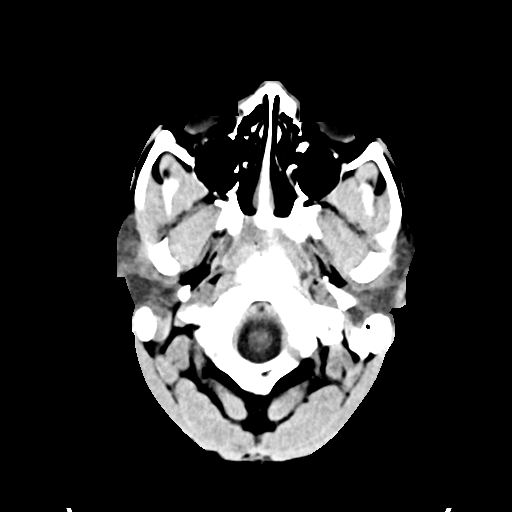
[im 4/32  bone]
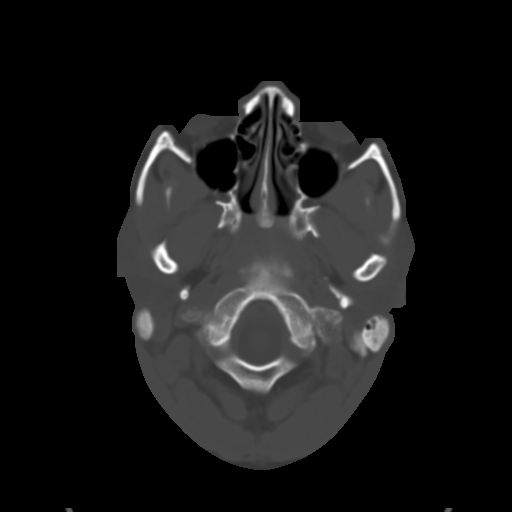
[im 8/32  brain]
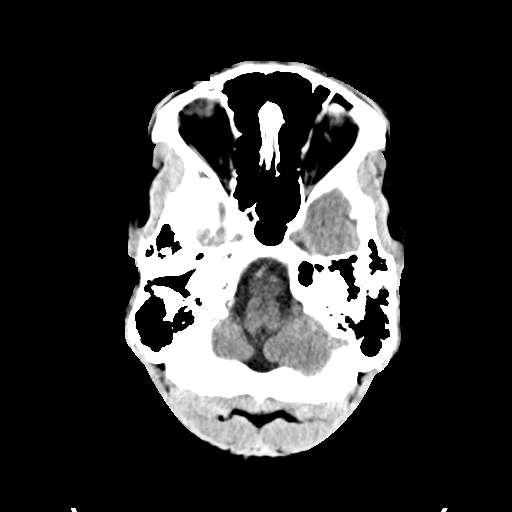
[im 12/32  brain]
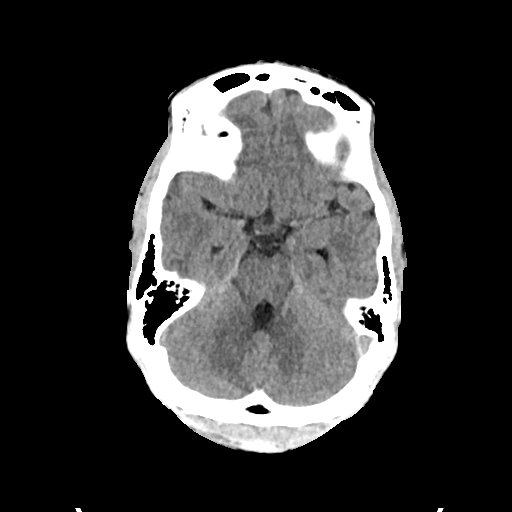
[im 16/32  brain]
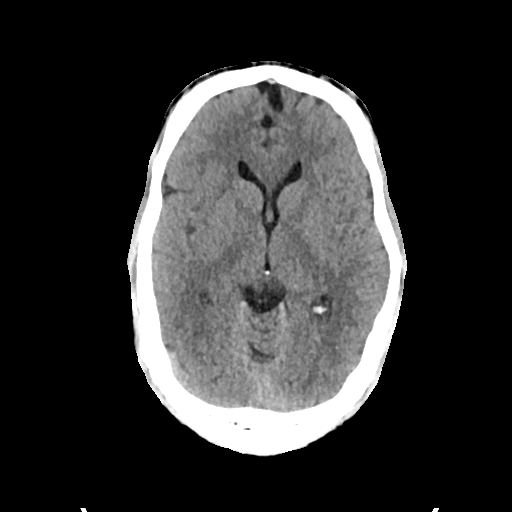
[im 20/32  brain]
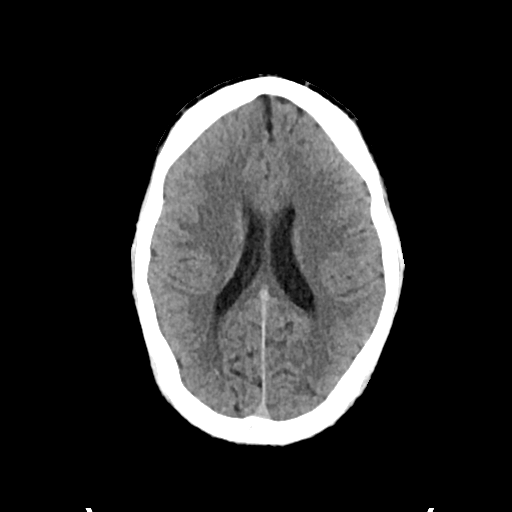
[im 20/32  bone]
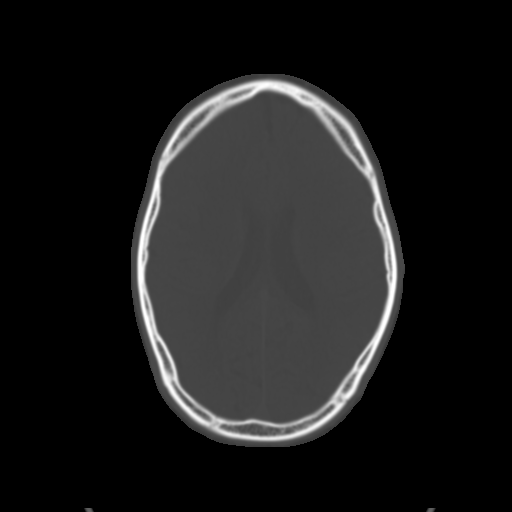
[im 24/32  brain]
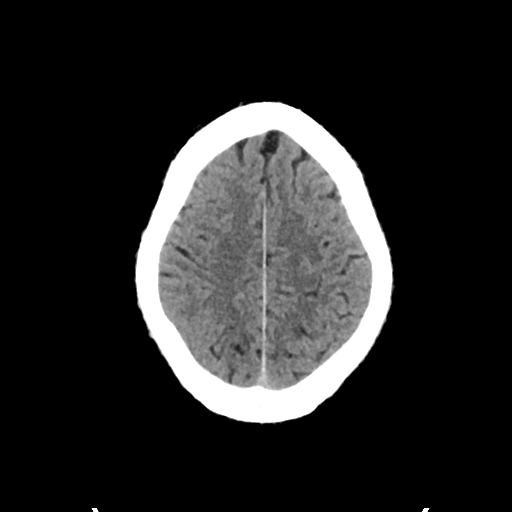
[im 28/32  brain]
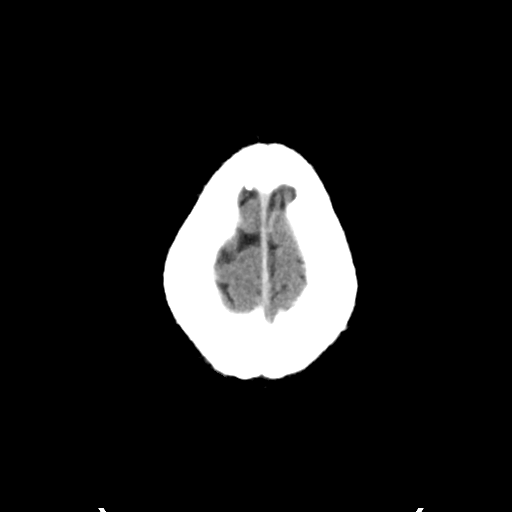

[Series 4: head bone · axial · 0.47mm/px · z∈[-140,-108]mm · 3 of 79 slices shown]
[im 8/79  bone]
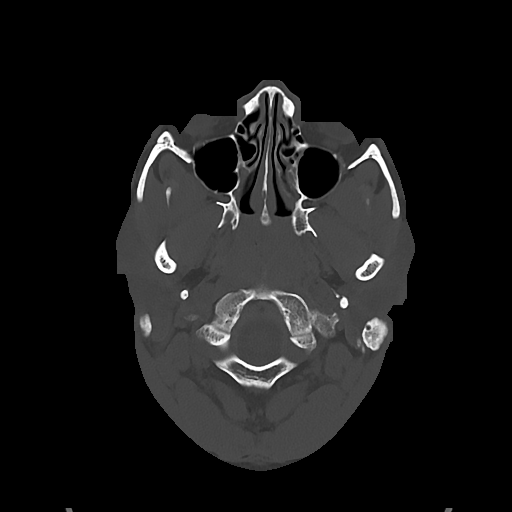
[im 16/79  bone]
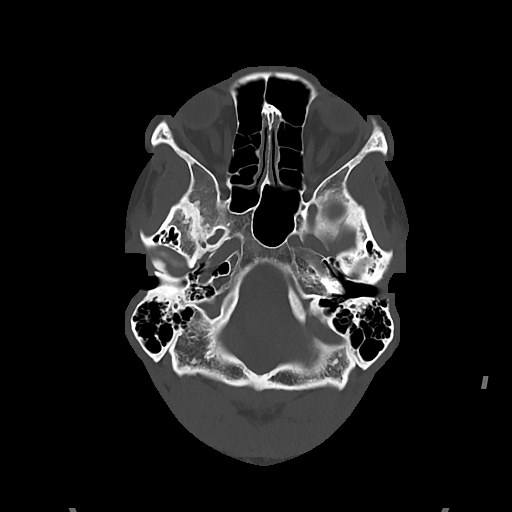
[im 24/79  bone]
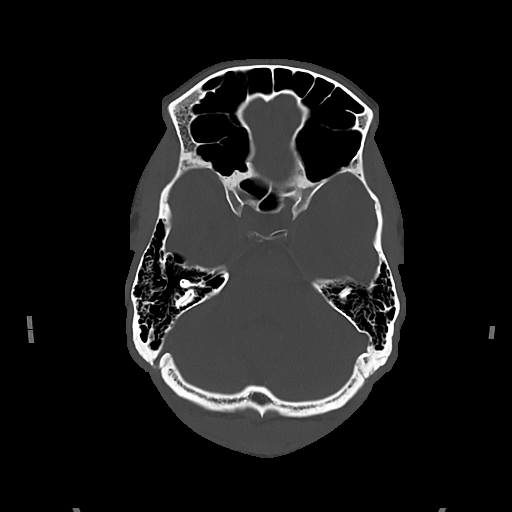

[Series 5: cor soft · coronal · 0.31mm/px · 3 of 79 slices shown]
[im 28/79  brain]
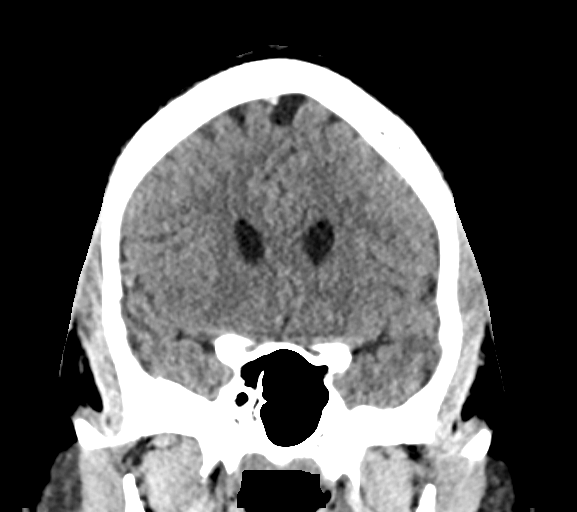
[im 36/79  brain]
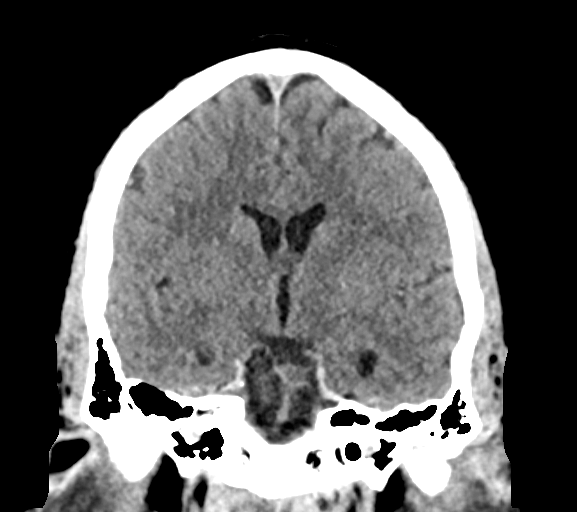
[im 43/79  brain]
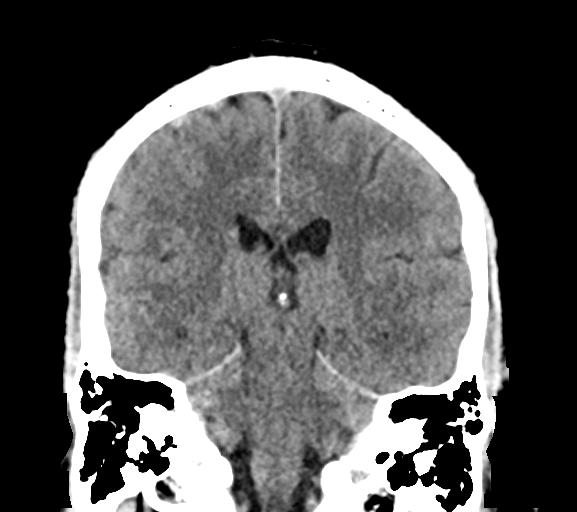

[Series 6: sag soft · sagittal · 0.31mm/px · 3 of 60 slices shown]
[im 20/60  brain]
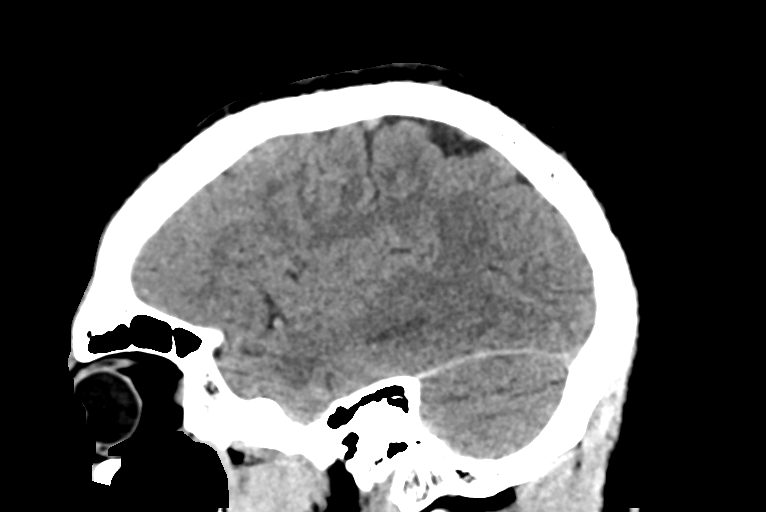
[im 30/60  brain]
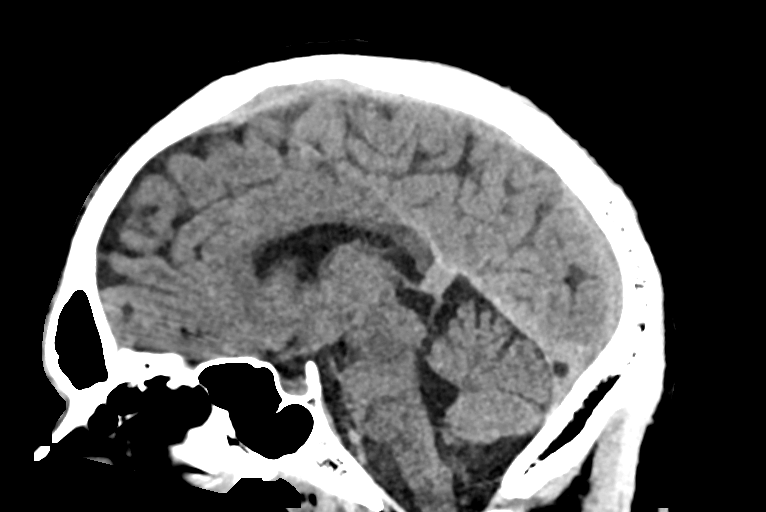
[im 40/60  brain]
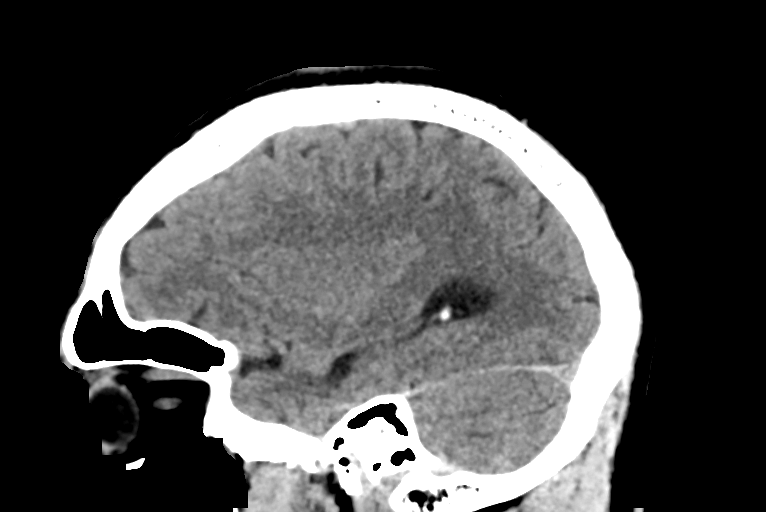

[16 of 47 positions shown; findings below may reference images not displayed]

FINDINGS: Brain: 16 mm prominent left anterior frontal extra-axial space with
widening of a sulcus, likely underlying arachnoid cyst (series 3,
image 17). No finding of stroke, hemorrhage, or focal mass effect of
the brain parenchyma. No extra-axial collection, hydrocephalus, or
herniation.

Vascular: No hyperdense vessel or unexpected calcification.

Skull: Normal. Negative for fracture or focal lesion.

Sinuses/Orbits: No acute finding.

Other: None.
IMPRESSION: 1. Left Anterior frontal region prominent extra-axial space widening
a sulcus, likely underlying arachnoid cyst.
2. No acute intracranial abnormality.

By: Jieqiong Taufiq M.D.

## 2020-08-01 ENCOUNTER — Emergency Department (HOSPITAL_COMMUNITY)
Admission: EM | Admit: 2020-08-01 | Discharge: 2020-08-01 | Disposition: A | Discharge status: Home / Self Care | Payer: Self-pay | Attending: Emergency Medicine | Admitting: Emergency Medicine

## 2020-08-01 ENCOUNTER — Encounter (HOSPITAL_COMMUNITY): Payer: Self-pay | Admitting: Emergency Medicine

## 2020-08-01 ENCOUNTER — Other Ambulatory Visit: Payer: Self-pay

## 2020-08-01 DIAGNOSIS — R519 Headache, unspecified: Secondary | ICD-10-CM | POA: Insufficient documentation

## 2020-08-01 DIAGNOSIS — R1110 Vomiting, unspecified: Secondary | ICD-10-CM | POA: Insufficient documentation

## 2020-08-01 MED ORDER — DROPERIDOL 2.5 MG/ML IJ SOLN
1.2500 mg | Freq: Once | INTRAMUSCULAR | Status: AC
  Administered 2020-08-01: 1.25 mg via INTRAMUSCULAR
  Filled 2020-08-01: qty 2

## 2020-08-01 NOTE — ED Provider Notes (Signed)
Emergency Medicine Provider Triage Evaluation Note  Eugene Middleton , a 30 y.o. male  was evaluated in triage.  Pt complains of headache.  This has been ongoing since Tuesday.  Started gradually and slowly worsened.  Associated with some vomiting.  No visual changes, changes in speech, numbness or weakness.  No fevers or neck stiffness.  Has history of frequent headaches and this 1 feels similar and is just not improved at all with advil.  Review of Systems  Positive: Headache, vomiting Negative: Fevers, vision changes, weakness, numbness, neck stiffness  Physical Exam  BP (!) 142/83 (BP Location: Right Arm)   Pulse 60   Temp 98 F (36.7 C) (Oral)   Resp 16   SpO2 99%  Gen:   Awake, no distress   Resp:  Normal effort  MSK:   Moves extremities without difficulty  Other:  No focal neurologic deficits, no nuchal rigidity  Medical Decision Making  Medically screening exam initiated at 12:51 PM.  Appropriate orders placed.  Eugene Middleton was informed that the remainder of the evaluation will be completed by another provider, this initial triage assessment does not replace that evaluation, and the importance of remaining in the ED until their evaluation is complete.     Dartha Lodge, PA-C 08/01/20 1307    Benjiman Core, MD 08/01/20 1601

## 2020-08-01 NOTE — ED Triage Notes (Signed)
Pt here from home with c/o h/a worse than the ones that he usually gets , and lasting longer than before

## 2020-08-01 NOTE — ED Provider Notes (Signed)
[facility] EMERGENCY DEPARTMENT Provider Note   CSN: [id number] Arrival date & time: 08/01/20  1219     History No chief complaint on file.   River Emmanuelle Coxe is a 30 y.o. male.  The history is provided by the patient and medical records.   Rohn Marlee Trentman is a 30 y.o. male who presents to the Emergency Department complaining of headache. He presents the emergency department complaining of frontal headache that started three days ago. Headache is constant nature. He took Tylenol and Advil at home with partial improvement in his symptoms. He has a history of headaches and this is similar to his prior headaches only it is lasting longer. No fevers, chest pain. He did vomit four times yesterday. No new numbness, weakness. He has a remote history of seizures but has been weaned off of his seizure medications.    History reviewed. No pertinent past medical history.  There are no problems to display for this patient.   Past Surgical History:  Procedure Laterality Date  . I & D EXTREMITY Right 05/28/2019   Procedure: IRRIGATION AND DEBRIDEMENT EXTREMITY;  Surgeon: Yolonda Kida, MD;  Location: [facility];  Service: Orthopedics;  Laterality: Right;       No family history on file.  Social History   Tobacco Use  . Smoking status: Never Smoker  . Smokeless tobacco: Never Used  Vaping Use  . Vaping Use: Never used  Substance Use Topics  . Alcohol use: No  . Drug use: No    Home Medications Prior to Admission medications   Medication Sig Start Date End Date Taking? Authorizing Provider  ibuprofen (ADVIL) 800 MG tablet Take 1 tablet (800 mg total) by mouth every 8 (eight) hours as needed. Patient not taking: Reported on 05/28/2019 05/08/19   Charlestine Night, PA-C    Allergies    Patient has no known allergies.  Review of Systems   Review of Systems  All other systems reviewed and are negative.   Physical Exam Updated Vital  Signs BP (!) 152/72 (BP Location: Right Arm)   Pulse (!) 57   Temp 98 F (36.7 C) (Oral)   Resp 17   SpO2 100%   Physical Exam Vitals and nursing note reviewed.  Constitutional:      Appearance: He is well-developed.  HENT:     Head: Normocephalic and atraumatic.  Cardiovascular:     Rate and Rhythm: Normal rate and regular rhythm.     Heart sounds: No murmur heard.   Pulmonary:     Effort: Pulmonary effort is normal. No respiratory distress.     Breath sounds: Normal breath sounds.  Abdominal:     Palpations: Abdomen is soft.     Tenderness: There is no abdominal tenderness. There is no guarding or rebound.  Musculoskeletal:        General: No tenderness.  Skin:    General: Skin is warm and dry.  Neurological:     Mental Status: He is alert and oriented to person, place, and time.     Comments: No asymmetry of facial movements. Five out of five strength in all four extremities with sensational light touch intact in all four extremities  Psychiatric:        Behavior: Behavior normal.     ED Results / Procedures / Treatments   Labs (all labs ordered are listed, but only abnormal results are displayed) Labs Reviewed - No data to display  EKG None  Radiology No  results found.  Procedures Procedures   Medications Ordered in ED Medications  droperidol (INAPSINE) 2.5 MG/ML injection 1.25 mg (has no administration in time range)    ED Course  I have reviewed the triage vital signs and the nursing notes.  Pertinent labs & imaging results that were available during my care of the patient were reviewed by me and considered in my medical decision making (see chart for details).    MDM Rules/Calculators/A&P                         patient here for evaluation of several days of headache, history of similar episodes in the past. He is neurologically intact on evaluation. Presentation is not consistent with meningitis, subarachnoid hemorrhage. After treatment with  medications in the emergency department his headache is resolved. Discussed with patient home care for headache, outpatient follow-up and return precautions.  Final Clinical Impression(s) / ED Diagnoses Final diagnoses:  Bad headache    Rx / DC Orders ED Discharge Orders    None       Tilden Fossa, MD 08/01/20 2137

## 2021-06-17 IMAGING — DX DG HAND COMPLETE 3+V*R*
3 series · 3 of 3 positions shown · non-contrast
Comparison: None.

CLINICAL DATA: Swelling and soreness.  No known injury.

EXAM:
RIGHT HAND - COMPLETE 3+ VIEW

[hand ap]
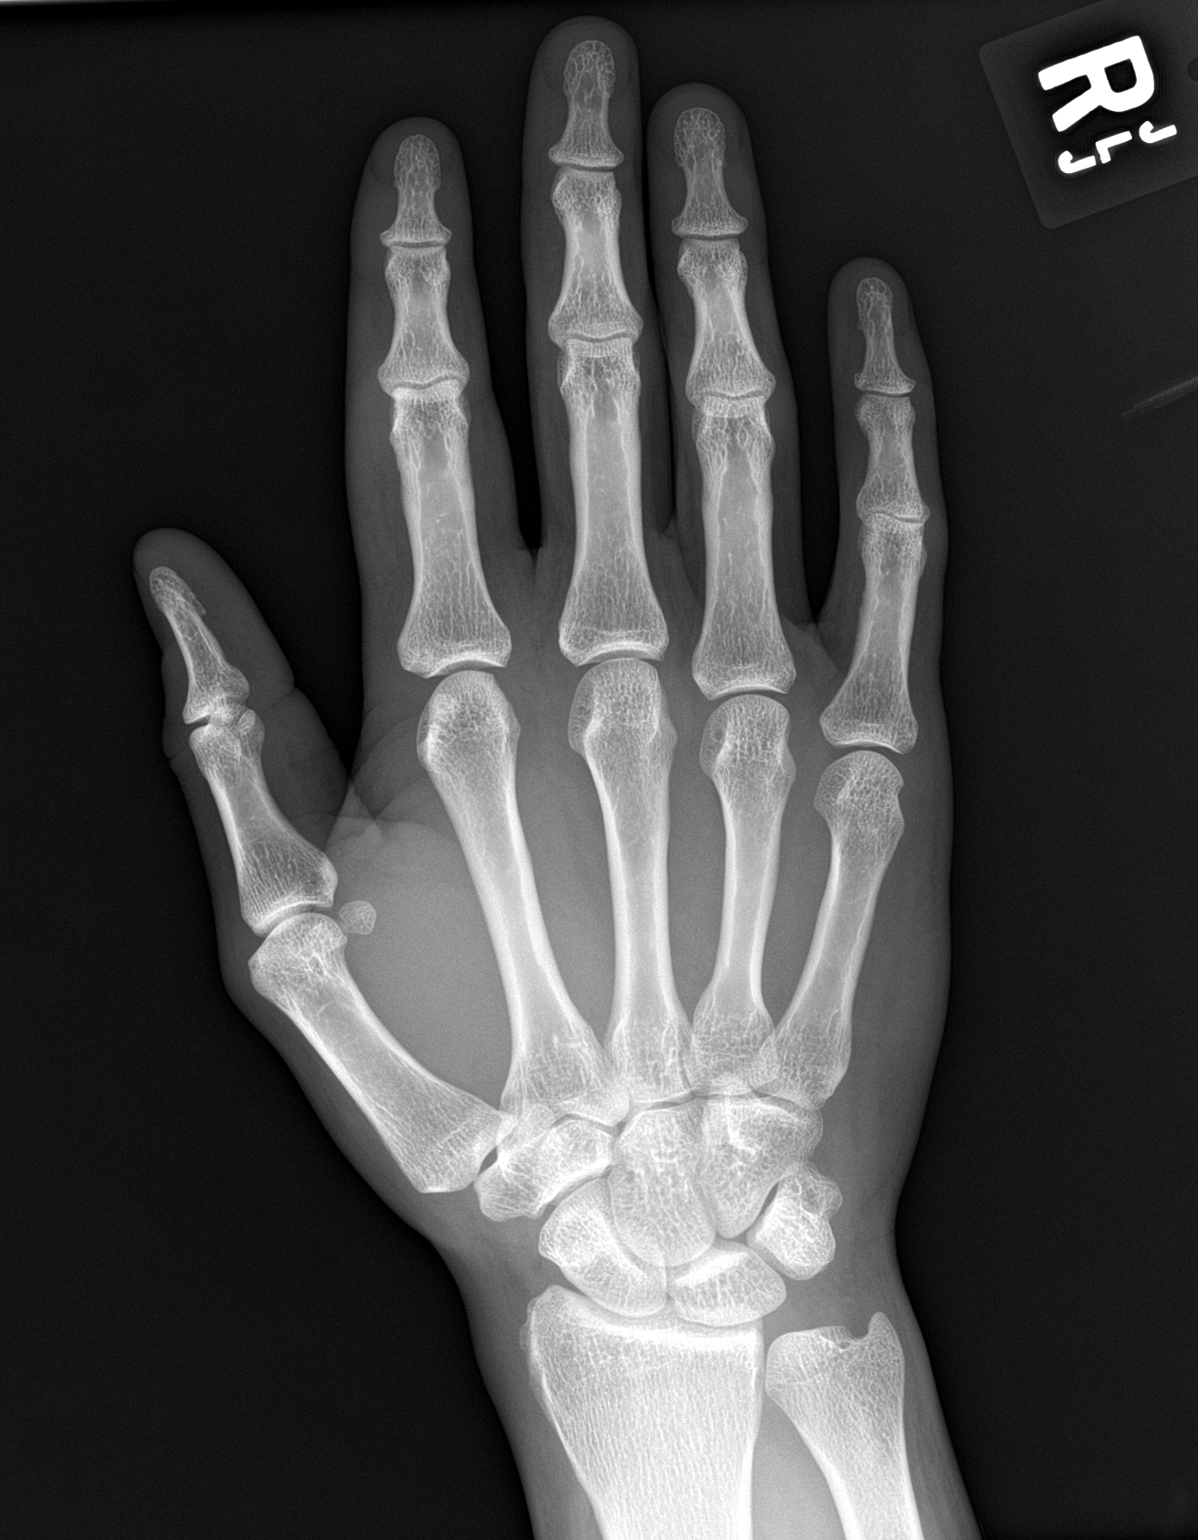

[hand obl]
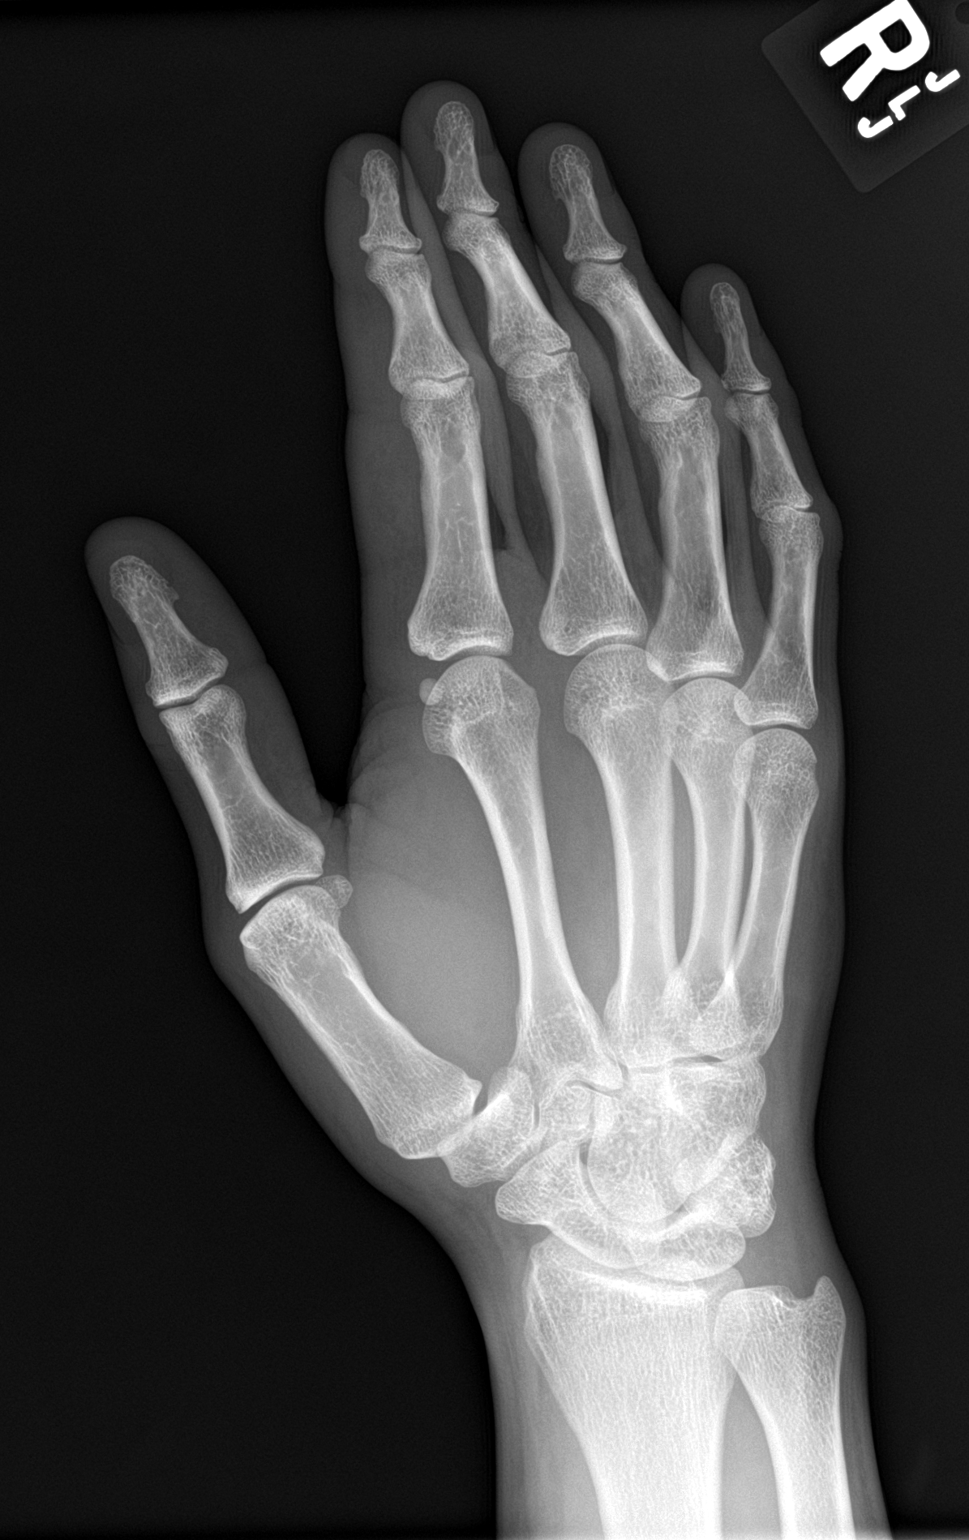

[hand lat]
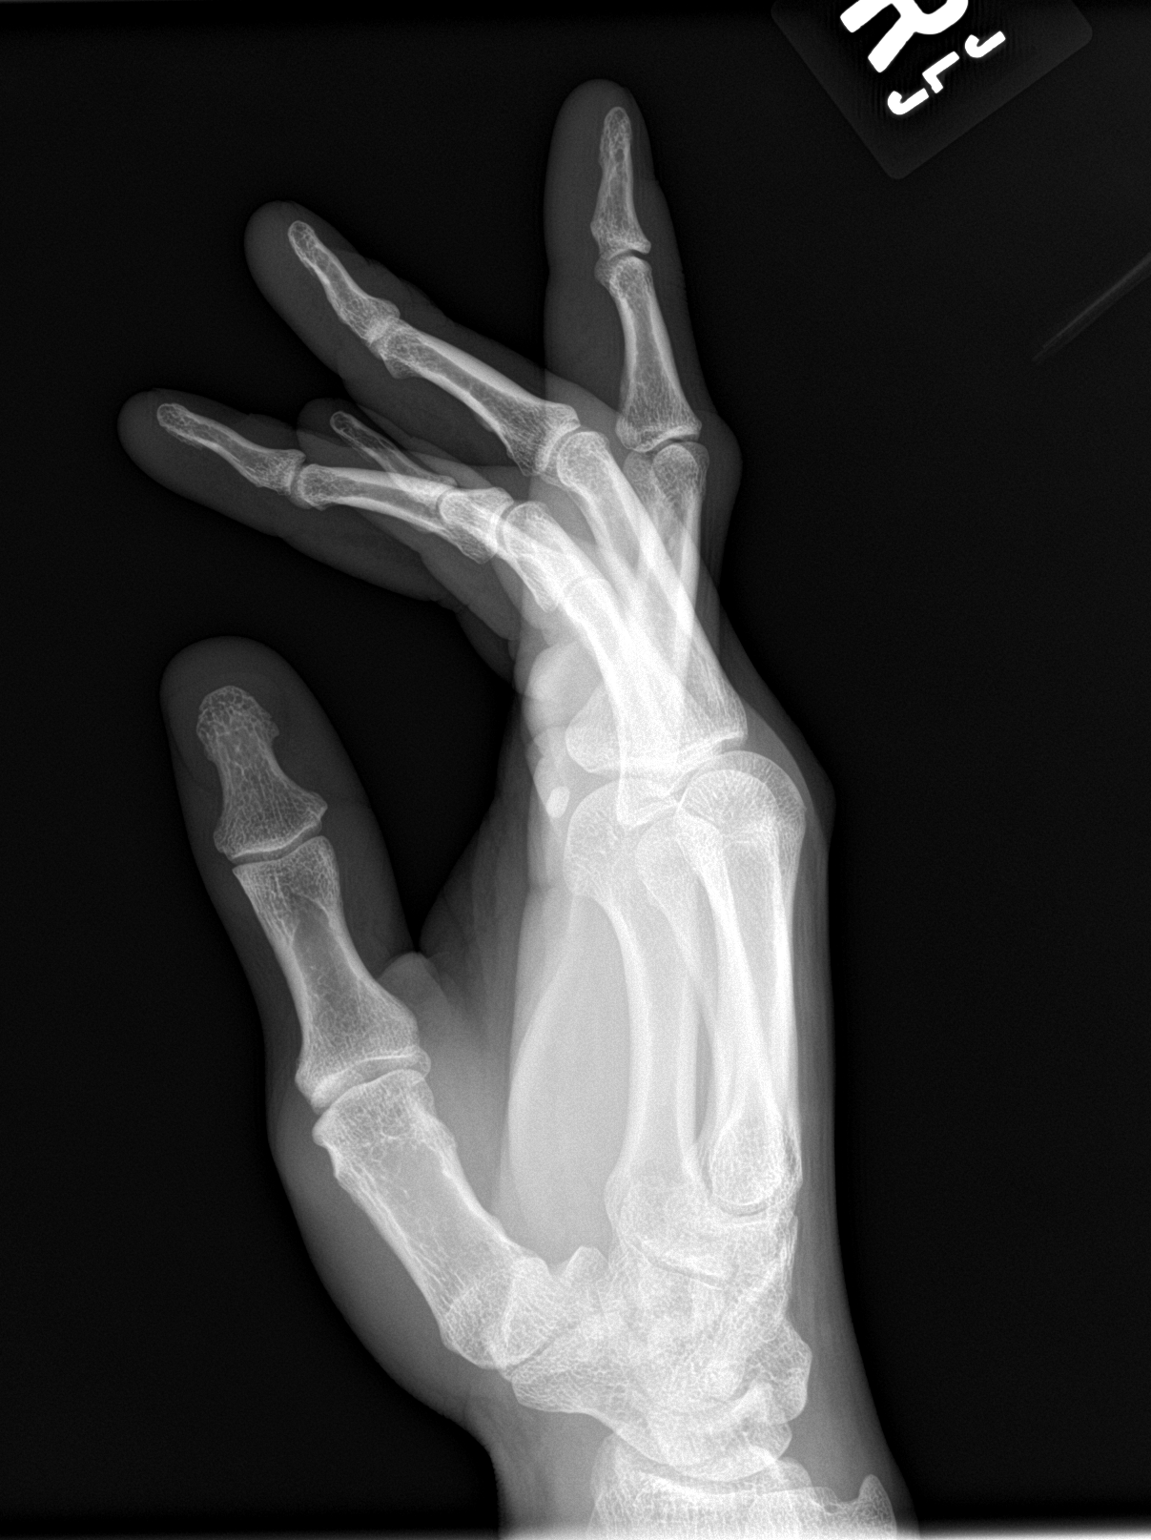

[3 of 3 positions shown; findings below may reference images not displayed]

FINDINGS: Evidence of fracture. No evidence of arthritis or focal lesion. No
sign of radiopaque foreign object.
IMPRESSION: No bone or joint abnormality.  No radiopaque foreign object.

## 2022-05-24 ENCOUNTER — Encounter (HOSPITAL_COMMUNITY): Payer: Self-pay

## 2022-05-24 ENCOUNTER — Ambulatory Visit (HOSPITAL_COMMUNITY)
Admission: EM | Admit: 2022-05-24 | Discharge: 2022-05-24 | Disposition: A | Discharge status: Home / Self Care | Payer: 59 | Attending: Family Medicine | Admitting: Family Medicine

## 2022-05-24 DIAGNOSIS — M79672 Pain in left foot: Secondary | ICD-10-CM | POA: Diagnosis not present

## 2022-05-24 DIAGNOSIS — M79671 Pain in right foot: Secondary | ICD-10-CM

## 2022-05-24 NOTE — ED Provider Notes (Signed)
[place]    CSN: [id number] Arrival date & time: 05/24/22  1350      History   Chief Complaint Chief Complaint  Patient presents with   feet pain     HPI Eugene Middleton is a 32 y.o. male.   Patient presents to urgent care for evaluation of bilateral feet pain to the bottoms of the feet that started approximately 1 week ago.  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  He recently got new shoes with better arch support a couple of days ago after experiencing foot pain, however this has not helped very much with the pain.  He has been taking Advil as needed for pain with some relief.  No history of or recent injuries or trauma to the bilateral feet.  No numbness or tingling to the bilateral lower extremities or history of diabetes. He has never experienced this in the past. Patient states his mother purchased a mat to go on the floor at work to pad his feet but his work will not allow him to use it.      History reviewed. No pertinent past medical history.  There are no problems to display for this patient.   Past Surgical History:  Procedure Laterality Date   I & D EXTREMITY Right 05/28/2019   Procedure: IRRIGATION AND DEBRIDEMENT EXTREMITY;  Surgeon: Nicholes Stairs, MD;  Location: [facility];  Service: Orthopedics;  Laterality: Right;       Home Medications    Prior to Admission medications   Medication Sig Start Date End Date Taking? Authorizing Provider  ibuprofen (ADVIL) 800 MG tablet Take 1 tablet (800 mg total) by mouth every 8 (eight) hours as needed. 05/08/19  Yes Lawyer, Harrell Gave, PA-C    Family History History reviewed. No pertinent family history.  Social History Social History   Tobacco Use   Smoking status: Never   Smokeless tobacco: Never  Vaping Use   Vaping Use: Never used  Substance Use Topics   Alcohol use: No   Drug use: No     Allergies   Patient has no  known allergies.   Review of Systems Review of Systems Per HPI  Physical Exam Triage Vital Signs ED Triage Vitals  Enc Vitals Group     BP 05/24/22 1558 128/79     Pulse Rate 05/24/22 1558 64     Resp 05/24/22 1558 16     Temp 05/24/22 1558 97.8 F (36.6 C)     Temp Source 05/24/22 1558 Oral     SpO2 05/24/22 1558 97 %     Weight --      Height --      Head Circumference --      Peak Flow --      Pain Score 05/24/22 1555 7     Pain Loc --      Pain Edu? --      Excl. in [place]? --    No data found.  Updated Vital Signs BP 128/79 (BP Location: Left Arm)   Pulse 64   Temp 97.8 F (36.6 C) (Oral)   Resp 16   SpO2 97%   Visual Acuity Right Eye Distance:   Left Eye Distance:   Bilateral Distance:    Right Eye Near:   Left Eye Near:    Bilateral Near:     Physical Exam Vitals and nursing note reviewed.  Constitutional:  Appearance: He is not ill-appearing or toxic-appearing.  HENT:     Head: Normocephalic and atraumatic.     Right Ear: Hearing and external ear normal.     Left Ear: Hearing and external ear normal.     Nose: Nose normal.     Mouth/Throat:     Lips: Pink.  Eyes:     General: Lids are normal. Vision grossly intact. Gaze aligned appropriately.     Extraocular Movements: Extraocular movements intact.     Conjunctiva/sclera: Conjunctivae normal.  Cardiovascular:     Rate and Rhythm: Normal rate and regular rhythm.     Pulses:          Dorsalis pedis pulses are 2+ on the right side and 2+ on the left side.       Posterior tibial pulses are 2+ on the right side and 2+ on the left side.     Heart sounds: Normal heart sounds, S1 normal and S2 normal.  Pulmonary:     Effort: Pulmonary effort is normal. No respiratory distress.     Breath sounds: Normal breath sounds and air entry.  Musculoskeletal:     Cervical back: Neck supple.     Right lower leg: No edema.     Left lower leg: No edema.     Right foot: Normal range of motion. No deformity  or bunion.     Left foot: Normal range of motion. No deformity or bunion.  Feet:     Right foot:     Skin integrity: Skin integrity normal.     Toenail Condition: Right toenails are normal.     Left foot:     Skin integrity: Skin integrity normal.     Toenail Condition: Left toenails are normal.  Skin:    General: Skin is warm and dry.     Capillary Refill: Capillary refill takes less than 2 seconds.     Findings: No rash.  Neurological:     General: No focal deficit present.     Mental Status: He is alert and oriented to person, place, and time. Mental status is at baseline.     Cranial Nerves: No dysarthria or facial asymmetry.  Psychiatric:        Mood and Affect: Mood normal.        Speech: Speech normal.        Behavior: Behavior normal.        Thought Content: Thought content normal.        Judgment: Judgment normal.      UC Treatments / Results  Labs (all labs ordered are listed, but only abnormal results are displayed) Labs Reviewed - No data to display  EKG   Radiology No results found.  Procedures Procedures (including critical care time)  Medications Ordered in UC Medications - No data to display  Initial Impression / Assessment and Plan / UC Course  I have reviewed the triage vital signs and the nursing notes.  Pertinent labs & imaging results that were available during my care of the patient were reviewed by me and considered in my medical decision making (see chart for details).   1. 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 Felicie Morn shoe inserts for added support, and use of appropriately fitting supportive tennis shoes while at work.  Ice 20 minutes on 20 minutes off after work will help to reduce swelling and inflammation.  Feet are not swollen today and musculoskeletal exam is stable.  Spoke with patient's mother at length to discuss plan.   She is in agreement with plan and so was the patient.  PCP assistance initiated today for follow-up.  May also follow-up with walking referral to podiatry as needed.   Discussed physical exam and available lab work findings in clinic with patient.  Counseled patient regarding appropriate use of medications and potential side effects for all medications recommended or prescribed today. Discussed red flag signs and symptoms of worsening condition,when to call the PCP office, return to urgent care, and when to seek higher level of care in the emergency department. Patient verbalizes understanding and agreement with plan. All questions answered. Patient discharged in stable condition.    Final Clinical Impressions(s) / UC Diagnoses   Final diagnoses:  Foot pain, bilateral     Discharge Instructions      Rest, compress, and elevate the feet at the end of shifts to reduce inflammation and pain. Wear supportive shoes.  Get Dr. Felicie Morn shoe inserts at [employer] for further feet support. Ice the feet and ankles after work. Use ibuprofen '600mg'$  every 6 hours as needed for pain and inflammation.   Follow-up with your primary care provider for further evaluation and management of your symptoms as well as ongoing wellness visits.  I hope you feel better!   ED Prescriptions   None    PDMP not reviewed this encounter.   Talbot Grumbling,  05/24/22 [phone]

## 2022-05-24 NOTE — Discharge Instructions (Signed)
Rest, compress, and elevate the feet at the end of shifts to reduce inflammation and pain. Wear supportive shoes.  Get Dr. Felicie Morn shoe inserts at [employer] for further feet support. Ice the feet and ankles after work. Use ibuprofen '600mg'$  every 6 hours as needed for pain and inflammation.   Follow-up with your primary care provider for further evaluation and management of your symptoms as well as ongoing wellness visits.  I hope you feel better!

## 2022-05-24 NOTE — ED Triage Notes (Signed)
Pt is here for feet pain x 1wk

## 2022-05-27 ENCOUNTER — Ambulatory Visit (INDEPENDENT_AMBULATORY_CARE_PROVIDER_SITE_OTHER): Payer: 59

## 2022-05-27 ENCOUNTER — Encounter: Payer: Self-pay | Admitting: Podiatry

## 2022-05-27 ENCOUNTER — Ambulatory Visit (INDEPENDENT_AMBULATORY_CARE_PROVIDER_SITE_OTHER): Payer: 59 | Admitting: Podiatry

## 2022-05-27 DIAGNOSIS — Q6689 Other  specified congenital deformities of feet: Secondary | ICD-10-CM | POA: Diagnosis not present

## 2022-05-27 DIAGNOSIS — M7752 Other enthesopathy of left foot: Secondary | ICD-10-CM

## 2022-05-27 DIAGNOSIS — M2141 Flat foot [pes planus] (acquired), right foot: Secondary | ICD-10-CM

## 2022-05-27 DIAGNOSIS — M7750 Other enthesopathy of unspecified foot: Secondary | ICD-10-CM

## 2022-05-27 DIAGNOSIS — M76829 Posterior tibial tendinitis, unspecified leg: Secondary | ICD-10-CM | POA: Diagnosis not present

## 2022-05-27 DIAGNOSIS — M2142 Flat foot [pes planus] (acquired), left foot: Secondary | ICD-10-CM | POA: Diagnosis not present

## 2022-05-27 DIAGNOSIS — M7751 Other enthesopathy of right foot: Secondary | ICD-10-CM | POA: Diagnosis not present

## 2022-05-27 MED ORDER — DICLOFENAC SODIUM 75 MG PO TBEC: 75.0000 mg | DELAYED_RELEASE_TABLET | Freq: Two times a day (BID) | ORAL | 0 refills | Status: DC

## 2022-05-27 NOTE — Progress Notes (Signed)
  Subjective:  Patient ID: Eugene Middleton, male    DOB: [birth year],  MRN: [id number]  Chief Complaint  Patient presents with   Foot Pain    New Patient - bilateral foot pain    32 y.o. male presents with the above complaint. History confirmed with patient.  He says he has pain in his arches, he is works on his feet quite a bit.  Also in the process of moving.  His mother is available by phone to discuss as well.  She is at the look at changes in shoes and arch support and were hoping to get a pad for pressure off his feet while he was working  Objective:  Physical Exam: warm, good capillary refill, no trophic changes or ulcerative lesions, normal DP and PT pulses, normal sensory exam, and fairly rigid pes planus deformity left worse than right he has pain and tenderness and a double medial Leyla sign noted, pain along the PT tendon sinus tarsi, edema and heat years well.   Radiographs: Multiple views x-ray of both feet: Severe pes planus deformity, hindfoot degenerative changes noted, left worse than right, there is periarticular spurring of the talonavicular and subtalar joint and sclerosis of the middle facet, poorly visualized middle facet on bilateral calcaneal axial views Assessment:   1. Tarsal coalition of both feet   2. Pes planus of both feet   3. PTTD (posterior tibial tendon dysfunction)      Plan:  Patient was evaluated and treated and all questions answered.  We reviewed his radiographs.  We discussed the presence of his rigid PTTD and pes planus deformity.  I discussed with him I am suspicious that he has a tarsal coalition bilaterally especially on the left side.  His axial weightbearing views were not completely definitive and I recommended advanced imaging with an MRI to evaluate the tendinous and ligamentous structures as well as the subtalar and talonavicular joint for tarsal coalition.  These have been ordered.  I will see him back with the MRI for further  planning.  I also discussed with his mother by phone.  Discussed using supportive shoes and arch supports.  May be a candidate for custom molded orthoses.  I will see him back after the MRIs and we will discuss further surgical intervention or nonoperative intervention.  In the interim I recommended use of an anti-inflammatory and I recommended diclofenac twice daily.  Ibuprofen has not completely eliminate his symptoms so prescription medications will be necessary.  Discussed possible side effects.  Return for after MRI to review.

## 2022-05-27 NOTE — Patient Instructions (Signed)
Call [facility] to schedule your MRI at the below locations.  Please allow at least 1 business day after your visit to process the referral.  It may take longer depending on approval from insurance.  Please let me know if you have issues or problems scheduling the MRI   [facility]  [phone] [place], [place] [address]  [place], [place] [address]    For shoes I recommend going to [employer] on [address] or [employer] on [address]

## 2022-06-23 ENCOUNTER — Other Ambulatory Visit: Payer: Self-pay | Admitting: Podiatry
